# Patient Record
Sex: Female | Born: 1935 | Race: White | Hispanic: No | Marital: Married | Smoking: Former smoker
Health system: Southern US, Community
[De-identification: ages and names within clinical notes are randomized; demographics above are authoritative.]

## PROBLEM LIST (undated history)

## (undated) DIAGNOSIS — N1832 Chronic kidney disease, stage 3b: Secondary | ICD-10-CM

## (undated) DIAGNOSIS — I10 Essential (primary) hypertension: Secondary | ICD-10-CM

## (undated) DIAGNOSIS — M1990 Unspecified osteoarthritis, unspecified site: Secondary | ICD-10-CM

## (undated) DIAGNOSIS — E7800 Pure hypercholesterolemia, unspecified: Secondary | ICD-10-CM

## (undated) DIAGNOSIS — N289 Disorder of kidney and ureter, unspecified: Secondary | ICD-10-CM

## (undated) DIAGNOSIS — I4891 Unspecified atrial fibrillation: Secondary | ICD-10-CM

## (undated) DIAGNOSIS — G4730 Sleep apnea, unspecified: Secondary | ICD-10-CM

## (undated) HISTORY — PX: SHOULDER SURGERY: SHX246

## (undated) HISTORY — PX: BOWEL RESECTION: SHX1257

## (undated) HISTORY — PX: ABDOMINAL HYSTERECTOMY: SHX81

---

## 2010-04-06 HISTORY — PX: PARATHYROIDECTOMY: SHX19

## 2012-10-24 ENCOUNTER — Ambulatory Visit: Payer: Self-pay | Admitting: Internal Medicine

## 2012-11-02 ENCOUNTER — Ambulatory Visit: Payer: Self-pay | Admitting: Internal Medicine

## 2016-08-03 ENCOUNTER — Encounter: Payer: Self-pay | Admitting: Emergency Medicine

## 2016-08-03 ENCOUNTER — Emergency Department
Admission: EM | Admit: 2016-08-03 | Discharge: 2016-08-03 | Disposition: A | Discharge status: Home / Self Care | Payer: Medicare Other | Attending: Emergency Medicine | Admitting: Emergency Medicine

## 2016-08-03 ENCOUNTER — Emergency Department: Payer: Medicare Other

## 2016-08-03 DIAGNOSIS — I480 Paroxysmal atrial fibrillation: Secondary | ICD-10-CM | POA: Diagnosis not present

## 2016-08-03 DIAGNOSIS — Z79899 Other long term (current) drug therapy: Secondary | ICD-10-CM | POA: Insufficient documentation

## 2016-08-03 DIAGNOSIS — I10 Essential (primary) hypertension: Secondary | ICD-10-CM | POA: Diagnosis not present

## 2016-08-03 DIAGNOSIS — R002 Palpitations: Secondary | ICD-10-CM | POA: Diagnosis present

## 2016-08-03 DIAGNOSIS — Z87891 Personal history of nicotine dependence: Secondary | ICD-10-CM | POA: Diagnosis not present

## 2016-08-03 HISTORY — DX: Unspecified osteoarthritis, unspecified site: M19.90

## 2016-08-03 HISTORY — DX: Essential (primary) hypertension: I10

## 2016-08-03 HISTORY — DX: Pure hypercholesterolemia, unspecified: E78.00

## 2016-08-03 HISTORY — DX: Disorder of kidney and ureter, unspecified: N28.9

## 2016-08-03 LAB — CBC WITH DIFFERENTIAL/PLATELET
Basophils Absolute: 0.1 10*3/uL (ref 0–0.1)
Basophils Relative: 1 %
EOS ABS: 0.1 10*3/uL (ref 0–0.7)
Eosinophils Relative: 2 %
HEMATOCRIT: 37.7 % (ref 35.0–47.0)
HEMOGLOBIN: 12.3 g/dL (ref 12.0–16.0)
LYMPHS ABS: 1.2 10*3/uL (ref 1.0–3.6)
Lymphocytes Relative: 16 %
MCH: 29.3 pg (ref 26.0–34.0)
MCHC: 32.7 g/dL (ref 32.0–36.0)
MCV: 89.5 fL (ref 80.0–100.0)
MONO ABS: 0.6 10*3/uL (ref 0.2–0.9)
MONOS PCT: 8 %
NEUTROS PCT: 73 %
Neutro Abs: 5.7 10*3/uL (ref 1.4–6.5)
Platelets: 239 10*3/uL (ref 150–440)
RBC: 4.21 MIL/uL (ref 3.80–5.20)
RDW: 13.8 % (ref 11.5–14.5)
WBC: 7.8 10*3/uL (ref 3.6–11.0)

## 2016-08-03 LAB — BASIC METABOLIC PANEL
Anion gap: 12 (ref 5–15)
BUN: 29 mg/dL — AB (ref 6–20)
CALCIUM: 9.6 mg/dL (ref 8.9–10.3)
CHLORIDE: 105 mmol/L (ref 101–111)
CO2: 21 mmol/L — ABNORMAL LOW (ref 22–32)
CREATININE: 1.12 mg/dL — AB (ref 0.44–1.00)
GFR, EST AFRICAN AMERICAN: 52 mL/min — AB (ref >60)
GFR, EST NON AFRICAN AMERICAN: 45 mL/min — AB (ref >60)
Glucose, Bld: 134 mg/dL — ABNORMAL HIGH (ref 65–99)
Potassium: 4.8 mmol/L (ref 3.5–5.1)
SODIUM: 138 mmol/L (ref 135–145)

## 2016-08-03 LAB — TSH: TSH: 2.616 u[IU]/mL (ref 0.350–4.500)

## 2016-08-03 LAB — TROPONIN I

## 2016-08-03 LAB — MAGNESIUM: Magnesium: 2.2 mg/dL (ref 1.7–2.4)

## 2016-08-03 MED ORDER — ASPIRIN 81 MG PO CHEW
324.0000 mg | CHEWABLE_TABLET | Freq: Once | ORAL | Status: AC
  Administered 2016-08-03: 324 mg via ORAL
  Filled 2016-08-03: qty 4

## 2016-08-03 NOTE — Discharge Instructions (Signed)
Check your pulse in the morning, if your heart rate is over 105, consider taking an extra atenolol as you  did today. If you have chest pain, shortness of breath, nausea, vomiting, weakness, leg swelling, you feels like you might pass out or any other new or worrisome symptoms return to the emergency room. Cardiology can see you  tomorrow morning at 10:30, Dr. Lady Gary, please do not fail to go.  The address is provided.

## 2016-08-03 NOTE — ED Triage Notes (Signed)
States took regular medications this morning.  Took 25 metoprolol last night and then repeated 25 mg metoprolol this morning at 0530.

## 2016-08-03 NOTE — ED Notes (Addendum)
Attempted IV x2 without success - one attempt left fa - second attempt right hand - purple top recollected for lab

## 2016-08-03 NOTE — ED Notes (Signed)
Pt reports she is having palpitations - she reports that her heart rate is going fast and then slow - this episode started at 3am - pt has no cardiac history - denies chest pain or shortness of breath - denies N/V

## 2016-08-03 NOTE — ED Notes (Signed)
Pt assisted to toilet to void 

## 2016-08-03 NOTE — ED Triage Notes (Signed)
C/O palpitations today, starting at 0300.  Palpitations woke patient up from sleep.  Denies chest pain or SOB.

## 2016-08-03 NOTE — ED Provider Notes (Addendum)
[facility] Emergency Department Provider Note  ____________________________________________   I have reviewed the triage vital signs and the nursing notes.   HISTORY  Chief Complaint Palpitations    HPI Stacy Conway is a 82 y.o. female  who presents today complaining of palpitations. Patient has had palpations off and on for several years. She states usually last about 10 minutes and go away. She did take extra atenolol, not metoprolol, this morning. 25 mg. She is still having a sensation that her heart rate is regular rate she denies chest pain shortness of breath she denies any lower shortly edema. She states that she does have an aspirin intolerance which is diarrhea but no history of rectal bleeding, and no anaphylactic reaction to aspirin. She has never had this evaluated by cardiology before. She denies any recent symptoms that might have brought this on such as URI or fatigue etc. She does not know of any problems with her thyroid and she has not had any change in her medications. She denies any fever or chills. She just states that she noticed that her heart is beating irregularly. Nothing makes it better nothing makes it worse is no pain there is no radiation, no alleviating or aggravate symptoms. The history of GI bleed melena bright red blood per rectum or head bleed that would be counter indicated to anticoagulation if needed.    Past Medical History:  Diagnosis Date  . Arthritis   . High cholesterol   . Hypertension   . Renal disorder    ckd    There are no active problems to display for this patient.   Past Surgical History:  Procedure Laterality Date  . ABDOMINAL HYSTERECTOMY    . BOWEL RESECTION    . PARATHYROIDECTOMY  2012  . SHOULDER SURGERY      Prior to Admission medications   Not on File    Allergies Aspirin; Nsaids; Penicillins; and Sulfa antibiotics  No family history on file.  Social History Social History   Substance Use Topics  . Smoking status: Former Games developer  . Smokeless tobacco: Never Used  . Alcohol use No    Review of Systems Constitutional: No fever/chills Eyes: No visual changes. ENT: No sore throat. No stiff neck no neck pain Cardiovascular: Denies chest pain. Respiratory: Denies shortness of breath. Gastrointestinal:   no vomiting.  No diarrhea.  No constipation. Genitourinary: Negative for dysuria. Musculoskeletal: Negative lower extremity swelling Skin: Negative for rash. Neurological: Negative for severe headaches, focal weakness or numbness. 10-point ROS otherwise negative.  ____________________________________________   PHYSICAL EXAM:  VITAL SIGNS: ED Triage Vitals  Enc Vitals Group     BP 08/03/16 1129 (!) 141/59     Pulse Rate 08/03/16 1129 (!) 104     Resp 08/03/16 1129 18     Temp 08/03/16 1129 97.4 F (36.3 C)     Temp Source 08/03/16 1129 Oral     SpO2 08/03/16 1129 96 %     Weight 08/03/16 1122 210 lb (95.3 kg)     Height 08/03/16 1122  (1.549 m)     Head Circumference --      Peak Flow --      Pain Score 08/03/16 1122 0     Pain Loc --      Pain Edu? --      Excl. in GC? --     Constitutional: Alert and oriented. Well appearing and in no acute distress. Eyes: Conjunctivae are normal. PERRL. EOMI. Head:  Atraumatic. Nose: No congestion/rhinnorhea. Mouth/Throat: Mucous membranes are moist.  Oropharynx non-erythematous. Neck: No stridor.   Nontender with no meningismus Cardiovascular: Normal rate, Irregularly irregular rhythm. Grossly normal heart sounds.  Good peripheral circulation. Respiratory: Normal respiratory effort.  No retractions. Lungs CTAB. Abdominal: Soft and nontender. No distention. No guarding no rebound Back:  There is no focal tenderness or step off.  there is no midline tenderness there are no lesions noted. there is no CVA tenderness Musculoskeletal: No lower extremity tenderness, no upper extremity tenderness. No joint  effusions, no DVT signs strong distal pulses no edema Neurologic:  Normal speech and language. No gross focal neurologic deficits are appreciated.  Skin:  Skin is warm, dry and intact. No rash noted. Psychiatric: Mood and affect are normal. Speech and behavior are normal.  ____________________________________________   LABS (all labs ordered are listed, but only abnormal results are displayed)  Labs Reviewed  BASIC METABOLIC PANEL - Abnormal; Notable for the following:       Result Value   CO2 21 (*)    Glucose, Bld 134 (*)    BUN 29 (*)    Creatinine, Ser 1.12 (*)    GFR calc non Af Amer 45 (*)    GFR calc Af Amer 52 (*)    All other components within normal limits  TROPONIN I  CBC WITH DIFFERENTIAL/PLATELET  TSH  MAGNESIUM   ____________________________________________  EKG  I personally interpreted any EKGs ordered by me or triage Atrial fibrillation rate 89 bpm, no acute ST elevation or depression, no acute ischemic changes, normal axis ____________________________________________  RADIOLOGY  I reviewed any imaging ordered by me or triage that were performed during my shift and, if possible, patient and/or family made aware of any abnormal findings. ____________________________________________   PROCEDURES  Procedure(s) performed: None  Procedures  Critical Care performed: None  ____________________________________________   INITIAL IMPRESSION / ASSESSMENT AND PLAN / ED COURSE  Pertinent labs & imaging results that were available during my care of the patient were reviewed by me and considered in my medical decision making (see chart for details).  Patient with minimally symptomatic rate controlled likely paroxysmal by history, but as far as medical care is concerned new-onset atrial fibrillation. There is no evidence of ischemia or ongoing chest pain. No shortness of breath. Patient is not allergic to aspirin and has given her diarrhea although she has  chronic diarrhea, and she is not actually sure of the aspirin contributes to it or not, we will give her a dose of aspirin here after discussion with cardiology. Rate has been well controlled on her home atenolol which she took an extra dose of today and pressures are good. No chest pain or shortness of breath, thyroid is reassuring magnesium is reassuring troponin is reassuring she is not anemic creatinine is reassuring etc.  ----------------------------------------- 1:13 PM on 08/03/2016 -----------------------------------------  Discussed with Dr. Lady Gary, of cardiology, and we discussed the patient's history, exam, vital signs, EKG, blood work and presentation, he feels the patient is safe for discharge at this time. He does not recommend emergent cardioversion. He does not recommend starting Eliquis or other significant anticoagulation. He does agree with giving the patient an aspirin here today, we discuss her relative intolerance of it, he will see her tomorrow morning at 10:30. Patient recovered with this plan, extensive return precautions given and understood. She is with her family. We will also inform her PCP.   ____________________________________________   FINAL CLINICAL IMPRESSION(S) / ED DIAGNOSES  Final diagnoses:  Palpitations      This chart was dictated using voice recognition software.  Despite best efforts to proofread,  errors can occur which can change meaning.      Jeanmarie Plant, MD 08/03/16 1303    Jeanmarie Plant, MD 08/03/16 1314

## 2017-04-12 ENCOUNTER — Inpatient Hospital Stay: Admission: RE | Admit: 2017-04-12 | Payer: Medicare Other | Source: Ambulatory Visit

## 2017-04-15 ENCOUNTER — Encounter
Admission: RE | Admit: 2017-04-15 | Discharge: 2017-04-15 | Disposition: A | Discharge status: Home / Self Care | Payer: Medicare Other | Source: Ambulatory Visit | Attending: Orthopedic Surgery | Admitting: Orthopedic Surgery

## 2017-04-15 ENCOUNTER — Other Ambulatory Visit: Payer: Self-pay

## 2017-04-15 DIAGNOSIS — Z9889 Other specified postprocedural states: Secondary | ICD-10-CM | POA: Diagnosis not present

## 2017-04-15 DIAGNOSIS — E785 Hyperlipidemia, unspecified: Secondary | ICD-10-CM | POA: Insufficient documentation

## 2017-04-15 DIAGNOSIS — Z886 Allergy status to analgesic agent status: Secondary | ICD-10-CM | POA: Diagnosis not present

## 2017-04-15 DIAGNOSIS — Z800 Family history of malignant neoplasm of digestive organs: Secondary | ICD-10-CM | POA: Insufficient documentation

## 2017-04-15 DIAGNOSIS — Z888 Allergy status to other drugs, medicaments and biological substances status: Secondary | ICD-10-CM | POA: Insufficient documentation

## 2017-04-15 DIAGNOSIS — Z7901 Long term (current) use of anticoagulants: Secondary | ICD-10-CM | POA: Insufficient documentation

## 2017-04-15 DIAGNOSIS — M1612 Unilateral primary osteoarthritis, left hip: Secondary | ICD-10-CM | POA: Insufficient documentation

## 2017-04-15 DIAGNOSIS — Z90710 Acquired absence of both cervix and uterus: Secondary | ICD-10-CM | POA: Insufficient documentation

## 2017-04-15 DIAGNOSIS — Z79899 Other long term (current) drug therapy: Secondary | ICD-10-CM | POA: Diagnosis not present

## 2017-04-15 DIAGNOSIS — Z8249 Family history of ischemic heart disease and other diseases of the circulatory system: Secondary | ICD-10-CM | POA: Insufficient documentation

## 2017-04-15 DIAGNOSIS — Z882 Allergy status to sulfonamides status: Secondary | ICD-10-CM | POA: Insufficient documentation

## 2017-04-15 DIAGNOSIS — E213 Hyperparathyroidism, unspecified: Secondary | ICD-10-CM | POA: Diagnosis not present

## 2017-04-15 DIAGNOSIS — I10 Essential (primary) hypertension: Secondary | ICD-10-CM | POA: Insufficient documentation

## 2017-04-15 DIAGNOSIS — Z880 Allergy status to penicillin: Secondary | ICD-10-CM | POA: Diagnosis not present

## 2017-04-15 DIAGNOSIS — Z881 Allergy status to other antibiotic agents status: Secondary | ICD-10-CM | POA: Diagnosis not present

## 2017-04-15 DIAGNOSIS — Z01812 Encounter for preprocedural laboratory examination: Secondary | ICD-10-CM | POA: Diagnosis not present

## 2017-04-15 HISTORY — DX: Sleep apnea, unspecified: G47.30

## 2017-04-15 HISTORY — DX: Unspecified atrial fibrillation: I48.91

## 2017-04-15 LAB — CBC
HEMATOCRIT: 39.8 % (ref 35.0–47.0)
HEMOGLOBIN: 12.9 g/dL (ref 12.0–16.0)
MCH: 29.9 pg (ref 26.0–34.0)
MCHC: 32.4 g/dL (ref 32.0–36.0)
MCV: 92.4 fL (ref 80.0–100.0)
Platelets: 277 10*3/uL (ref 150–440)
RBC: 4.31 MIL/uL (ref 3.80–5.20)
RDW: 13.8 % (ref 11.5–14.5)
WBC: 8.9 10*3/uL (ref 3.6–11.0)

## 2017-04-15 LAB — SURGICAL PCR SCREEN
MRSA, PCR: NEGATIVE
STAPHYLOCOCCUS AUREUS: NEGATIVE

## 2017-04-15 LAB — BASIC METABOLIC PANEL
ANION GAP: 12 (ref 5–15)
BUN: 32 mg/dL — AB (ref 6–20)
CO2: 22 mmol/L (ref 22–32)
Calcium: 9.3 mg/dL (ref 8.9–10.3)
Chloride: 105 mmol/L (ref 101–111)
Creatinine, Ser: 1.05 mg/dL — ABNORMAL HIGH (ref 0.44–1.00)
GFR calc Af Amer: 57 mL/min — ABNORMAL LOW (ref >60)
GFR, EST NON AFRICAN AMERICAN: 49 mL/min — AB (ref >60)
Glucose, Bld: 109 mg/dL — ABNORMAL HIGH (ref 65–99)
POTASSIUM: 4.7 mmol/L (ref 3.5–5.1)
SODIUM: 139 mmol/L (ref 135–145)

## 2017-04-15 LAB — URINALYSIS, ROUTINE W REFLEX MICROSCOPIC
Bilirubin Urine: NEGATIVE
Glucose, UA: NEGATIVE mg/dL
Hgb urine dipstick: NEGATIVE
Ketones, ur: NEGATIVE mg/dL
Leukocytes, UA: NEGATIVE
Nitrite: NEGATIVE
Protein, ur: NEGATIVE mg/dL
pH: 5.5 (ref 5.0–8.0)

## 2017-04-15 LAB — TYPE AND SCREEN
ABO/RH(D): A POS
Antibody Screen: NEGATIVE

## 2017-04-15 LAB — PROTIME-INR
INR: 1.08
PROTHROMBIN TIME: 13.9 s (ref 11.4–15.2)

## 2017-04-15 LAB — APTT: APTT: 32 s (ref 24–36)

## 2017-04-15 LAB — SEDIMENTATION RATE: SED RATE: 77 mm/h — AB (ref 0–30)

## 2017-04-15 NOTE — Patient Instructions (Signed)
Your procedure is scheduled on: Thursday 04/22/17 Report to DAY SURGERY. 2ND FLOOR MEDICAL MALL ENTRANCE. To find out your arrival time please call [phone] between 1PM - 3PM on Wednesday 04/21/17.  Remember: Instructions that are not followed completely may result in serious medical risk, up to and including death, or upon the discretion of your surgeon and anesthesiologist your surgery may need to be rescheduled.    __X__ 1. Do not eat anything after midnight the night before your    procedure.  No gum chewing or hard candies.  You may drink clear   liquids up to 2 hours before you are scheduled to arrive at the   hospital for your procedure. Do not drink clear liquids within 2   hours of scheduled arrival to the hospital as this may lead to your   procedure being delayed or rescheduled.       Clear liquids include:   Water or Apple juice without pulp   Clear carbohydrate beverage such as Clearfast or Gatorade   Black coffee or Clear Tea (no milk, no creamer, do not add anything   to the coffee or tea)    Diabetics should only drink water   __X__ 2. No Alcohol for 24 hours before or after surgery.   ____ 3. Bring all medications with you on the day of surgery if instructed.    __X__ 4. Notify your doctor if there is any change in your medical condition     (cold, fever, infections).             __X___5. No smoking within 24 hours of your surgery.     Do not wear jewelry, make-up, hairpins, clips or nail polish.  Do not wear lotions, powders, or perfumes.   Do not shave 48 hours prior to surgery. Men may shave face and neck.  Do not bring valuables to the hospital.    [facility] any belongings or valuables.               Contacts, dentures or bridgework may not be worn into surgery.  Leave your suitcase in the car. After surgery it may be brought to your room.  For patients admitted to the hospital, discharge time is determined by your                 treatment team.   Patients discharged the day of surgery will not be allowed to drive home.   Please read over the following fact sheets that you were given:   MRSA Information   __X__ Take these medicines the morning of surgery with A SIP OF WATER:    1. NONE  2.   3.   4.  5.  6.  ____ Fleet Enema (as directed)   __X__ Use CHG Soap/SAGE wipes as directed  ____ Use inhalers on the day of surgery  ____ Stop metformin 2 days prior to surgery    ____ Take 1/2 of usual insulin dose the night before surgery and none on the morning of surgery.   __X__ Stop ELIQUIS Coumadin/Plavix/aspirin on 04/20/17  __X__ Stop Anti-inflammatories such as Advil, Aleve, Ibuprofen, Motrin, Naproxen, Naprosyn, Goodies,powder, or aspirin products.  OK to take Tylenol.   __X__ Stop supplements, Vitamin E, Fish Oil until after surgery.  (CRANBERRY, OSTEO BI FLEX, LUTEIN, TURMERIC) TODAY  __X__ Bring C-Pap to the hospital.

## 2017-04-16 LAB — URINE CULTURE

## 2017-04-17 NOTE — Pre-Procedure Instructions (Signed)
Urine culture and UA results sent to Dr. Rosita KeaMenz for review.  Asked if wanted it recollected as suggested?  Also asked Dr. Rosita KeaMenz if he wanted to order knee high Teds and foley catheter in OR?

## 2017-04-21 MED ORDER — CLINDAMYCIN PHOSPHATE 900 MG/50ML IV SOLN
900.0000 mg | Freq: Once | INTRAVENOUS | Status: AC
  Administered 2017-04-22: 900 mg via INTRAVENOUS

## 2017-04-22 ENCOUNTER — Encounter: Payer: Self-pay | Admitting: *Deleted

## 2017-04-22 ENCOUNTER — Encounter
Admission: RE | Disposition: A | Discharge status: Skilled Nursing Facility | Payer: Self-pay | Source: Ambulatory Visit | Attending: Orthopedic Surgery

## 2017-04-22 ENCOUNTER — Inpatient Hospital Stay: Payer: Medicare Other

## 2017-04-22 ENCOUNTER — Inpatient Hospital Stay: Payer: Medicare Other | Admitting: Certified Registered"

## 2017-04-22 ENCOUNTER — Other Ambulatory Visit: Payer: Self-pay

## 2017-04-22 ENCOUNTER — Inpatient Hospital Stay
Admission: RE | Admit: 2017-04-22 | Discharge: 2017-04-25 | DRG: 470 | Disposition: A | Discharge status: Skilled Nursing Facility | Payer: Medicare Other | Source: Ambulatory Visit | Attending: Orthopedic Surgery | Admitting: Orthopedic Surgery

## 2017-04-22 DIAGNOSIS — M1612 Unilateral primary osteoarthritis, left hip: Secondary | ICD-10-CM | POA: Diagnosis present

## 2017-04-22 DIAGNOSIS — D62 Acute posthemorrhagic anemia: Secondary | ICD-10-CM | POA: Diagnosis not present

## 2017-04-22 DIAGNOSIS — Z882 Allergy status to sulfonamides status: Secondary | ICD-10-CM

## 2017-04-22 DIAGNOSIS — G8918 Other acute postprocedural pain: Secondary | ICD-10-CM

## 2017-04-22 DIAGNOSIS — N189 Chronic kidney disease, unspecified: Secondary | ICD-10-CM | POA: Diagnosis present

## 2017-04-22 DIAGNOSIS — Z886 Allergy status to analgesic agent status: Secondary | ICD-10-CM | POA: Diagnosis not present

## 2017-04-22 DIAGNOSIS — Z881 Allergy status to other antibiotic agents status: Secondary | ICD-10-CM | POA: Diagnosis not present

## 2017-04-22 DIAGNOSIS — I129 Hypertensive chronic kidney disease with stage 1 through stage 4 chronic kidney disease, or unspecified chronic kidney disease: Secondary | ICD-10-CM | POA: Diagnosis present

## 2017-04-22 DIAGNOSIS — F17200 Nicotine dependence, unspecified, uncomplicated: Secondary | ICD-10-CM | POA: Diagnosis present

## 2017-04-22 DIAGNOSIS — Z419 Encounter for procedure for purposes other than remedying health state, unspecified: Secondary | ICD-10-CM

## 2017-04-22 DIAGNOSIS — Z880 Allergy status to penicillin: Secondary | ICD-10-CM

## 2017-04-22 DIAGNOSIS — G4730 Sleep apnea, unspecified: Secondary | ICD-10-CM | POA: Diagnosis present

## 2017-04-22 DIAGNOSIS — Z91013 Allergy to seafood: Secondary | ICD-10-CM | POA: Diagnosis not present

## 2017-04-22 DIAGNOSIS — I4891 Unspecified atrial fibrillation: Secondary | ICD-10-CM | POA: Diagnosis present

## 2017-04-22 DIAGNOSIS — E7800 Pure hypercholesterolemia, unspecified: Secondary | ICD-10-CM | POA: Diagnosis present

## 2017-04-22 HISTORY — PX: TOTAL HIP ARTHROPLASTY: SHX124

## 2017-04-22 LAB — ABO/RH: ABO/RH(D): A POS

## 2017-04-22 SURGERY — ARTHROPLASTY, HIP, TOTAL, ANTERIOR APPROACH
Anesthesia: General | Site: Hip | Laterality: Left | Wound class: Clean

## 2017-04-22 MED ORDER — FENTANYL CITRATE (PF) 100 MCG/2ML IJ SOLN
INTRAMUSCULAR | Status: AC
  Filled 2017-04-22: qty 2

## 2017-04-22 MED ORDER — ONDANSETRON HCL 4 MG/2ML IJ SOLN: 4.0000 mg | Freq: Once | INTRAMUSCULAR | Status: DC | PRN

## 2017-04-22 MED ORDER — ONDANSETRON HCL 4 MG/2ML IJ SOLN
INTRAMUSCULAR | Status: AC
  Filled 2017-04-22: qty 2

## 2017-04-22 MED ORDER — BUPIVACAINE-EPINEPHRINE (PF) 0.25% -1:200000 IJ SOLN
INTRAMUSCULAR | Status: DC | PRN
  Administered 2017-04-22: 30 mL via PERINEURAL

## 2017-04-22 MED ORDER — BISACODYL 10 MG RE SUPP
10.0000 mg | Freq: Every day | RECTAL | Status: DC | PRN
  Administered 2017-04-25: 10 mg via RECTAL
  Filled 2017-04-22: qty 1

## 2017-04-22 MED ORDER — KETAMINE HCL 50 MG/ML IJ SOLN
INTRAMUSCULAR | Status: AC
  Filled 2017-04-22: qty 10

## 2017-04-22 MED ORDER — APIXABAN 5 MG PO TABS
5.0000 mg | ORAL_TABLET | Freq: Two times a day (BID) | ORAL | Status: DC
  Administered 2017-04-22 – 2017-04-25 (×6): 5 mg via ORAL
  Filled 2017-04-22 (×6): qty 1

## 2017-04-22 MED ORDER — SUGAMMADEX SODIUM 200 MG/2ML IV SOLN
INTRAVENOUS | Status: DC | PRN
  Administered 2017-04-22: 200 mg via INTRAVENOUS

## 2017-04-22 MED ORDER — SODIUM CHLORIDE 0.9 % IV SOLN
INTRAVENOUS | Status: DC
  Administered 2017-04-22: 10:00:00 via INTRAVENOUS
  Administered 2017-04-23: 75 mL/h via INTRAVENOUS

## 2017-04-22 MED ORDER — FAMOTIDINE 20 MG PO TABS
20.0000 mg | ORAL_TABLET | Freq: Once | ORAL | Status: AC
  Administered 2017-04-22: 20 mg via ORAL

## 2017-04-22 MED ORDER — OXYCODONE HCL 5 MG PO TABS
10.0000 mg | ORAL_TABLET | ORAL | Status: DC | PRN
  Administered 2017-04-22 – 2017-04-24 (×6): 10 mg via ORAL
  Filled 2017-04-22 (×6): qty 2

## 2017-04-22 MED ORDER — DOCUSATE SODIUM 100 MG PO CAPS
100.0000 mg | ORAL_CAPSULE | Freq: Two times a day (BID) | ORAL | Status: DC
  Administered 2017-04-22 – 2017-04-25 (×6): 100 mg via ORAL
  Filled 2017-04-22 (×7): qty 1

## 2017-04-22 MED ORDER — ALUM & MAG HYDROXIDE-SIMETH 200-200-20 MG/5ML PO SUSP: 30.0000 mL | ORAL | Status: DC | PRN

## 2017-04-22 MED ORDER — CLINDAMYCIN PHOSPHATE 900 MG/50ML IV SOLN
900.0000 mg | Freq: Four times a day (QID) | INTRAVENOUS | Status: AC
  Administered 2017-04-22 – 2017-04-23 (×3): 900 mg via INTRAVENOUS
  Filled 2017-04-22 (×3): qty 50

## 2017-04-22 MED ORDER — AMLODIPINE BESYLATE 10 MG PO TABS
10.0000 mg | ORAL_TABLET | Freq: Every evening | ORAL | Status: DC
  Administered 2017-04-23 – 2017-04-24 (×2): 10 mg via ORAL
  Filled 2017-04-22 (×2): qty 1

## 2017-04-22 MED ORDER — SIMVASTATIN 20 MG PO TABS
20.0000 mg | ORAL_TABLET | Freq: Every evening | ORAL | Status: DC
  Administered 2017-04-22 – 2017-04-24 (×3): 20 mg via ORAL
  Filled 2017-04-22 (×3): qty 1

## 2017-04-22 MED ORDER — OXYCODONE HCL 5 MG PO TABS
5.0000 mg | ORAL_TABLET | ORAL | Status: DC | PRN
  Administered 2017-04-23 – 2017-04-25 (×4): 5 mg via ORAL
  Filled 2017-04-22 (×5): qty 1

## 2017-04-22 MED ORDER — METHOCARBAMOL 1000 MG/10ML IJ SOLN
500.0000 mg | Freq: Four times a day (QID) | INTRAVENOUS | Status: DC | PRN
  Filled 2017-04-22: qty 5

## 2017-04-22 MED ORDER — METOCLOPRAMIDE HCL 5 MG/ML IJ SOLN: 5.0000 mg | Freq: Three times a day (TID) | INTRAMUSCULAR | Status: DC | PRN

## 2017-04-22 MED ORDER — ACETAMINOPHEN 650 MG RE SUPP: 650.0000 mg | RECTAL | Status: DC | PRN

## 2017-04-22 MED ORDER — LIDOCAINE HCL (CARDIAC) 20 MG/ML IV SOLN
INTRAVENOUS | Status: DC | PRN
  Administered 2017-04-22: 50 mg via INTRAVENOUS

## 2017-04-22 MED ORDER — METOCLOPRAMIDE HCL 10 MG PO TABS: 5.0000 mg | ORAL_TABLET | Freq: Three times a day (TID) | ORAL | Status: DC | PRN

## 2017-04-22 MED ORDER — DEXAMETHASONE SODIUM PHOSPHATE 10 MG/ML IJ SOLN
INTRAMUSCULAR | Status: DC | PRN
  Administered 2017-04-22: 5 mg via INTRAVENOUS

## 2017-04-22 MED ORDER — ZOLPIDEM TARTRATE 5 MG PO TABS: 5.0000 mg | ORAL_TABLET | Freq: Every evening | ORAL | Status: DC | PRN

## 2017-04-22 MED ORDER — PHENOL 1.4 % MT LIQD
1.0000 | OROMUCOSAL | Status: DC | PRN
  Filled 2017-04-22: qty 177

## 2017-04-22 MED ORDER — MIDAZOLAM HCL 2 MG/2ML IJ SOLN
INTRAMUSCULAR | Status: AC
  Filled 2017-04-22: qty 2

## 2017-04-22 MED ORDER — DEXAMETHASONE SODIUM PHOSPHATE 10 MG/ML IJ SOLN
INTRAMUSCULAR | Status: AC
  Filled 2017-04-22: qty 1

## 2017-04-22 MED ORDER — ACETAMINOPHEN 325 MG PO TABS: 650.0000 mg | ORAL_TABLET | ORAL | Status: DC | PRN

## 2017-04-22 MED ORDER — OXYCODONE HCL 5 MG PO TABS
ORAL_TABLET | ORAL | Status: AC
  Administered 2017-04-22: 10 mg
  Filled 2017-04-22: qty 2

## 2017-04-22 MED ORDER — PHENYLEPHRINE HCL 10 MG/ML IJ SOLN
INTRAMUSCULAR | Status: AC
  Filled 2017-04-22: qty 1

## 2017-04-22 MED ORDER — MORPHINE SULFATE (PF) 4 MG/ML IV SOLN
INTRAVENOUS | Status: AC
  Administered 2017-04-22: 2 mg via INTRAVENOUS
  Filled 2017-04-22: qty 1

## 2017-04-22 MED ORDER — SUCCINYLCHOLINE CHLORIDE 20 MG/ML IJ SOLN
INTRAMUSCULAR | Status: DC | PRN
  Administered 2017-04-22: 100 mg via INTRAVENOUS

## 2017-04-22 MED ORDER — MENTHOL 3 MG MT LOZG
1.0000 | LOZENGE | OROMUCOSAL | Status: DC | PRN
  Filled 2017-04-22: qty 9

## 2017-04-22 MED ORDER — MORPHINE SULFATE (PF) 4 MG/ML IV SOLN
2.0000 mg | INTRAVENOUS | Status: DC | PRN
  Administered 2017-04-22 (×4): 2 mg via INTRAVENOUS
  Filled 2017-04-22: qty 1

## 2017-04-22 MED ORDER — FENTANYL CITRATE (PF) 100 MCG/2ML IJ SOLN
INTRAMUSCULAR | Status: AC
  Administered 2017-04-22: 25 ug via INTRAVENOUS
  Filled 2017-04-22: qty 2

## 2017-04-22 MED ORDER — LISINOPRIL 20 MG PO TABS
20.0000 mg | ORAL_TABLET | Freq: Every evening | ORAL | Status: DC
  Administered 2017-04-22 – 2017-04-24 (×3): 20 mg via ORAL
  Filled 2017-04-22 (×3): qty 1

## 2017-04-22 MED ORDER — FENTANYL CITRATE (PF) 100 MCG/2ML IJ SOLN
25.0000 ug | INTRAMUSCULAR | Status: AC | PRN
  Administered 2017-04-22 (×6): 25 ug via INTRAVENOUS

## 2017-04-22 MED ORDER — PROPOFOL 10 MG/ML IV BOLUS
INTRAVENOUS | Status: DC | PRN
  Administered 2017-04-22: 150 mg via INTRAVENOUS
  Administered 2017-04-22: 50 mg via INTRAVENOUS

## 2017-04-22 MED ORDER — NEOMYCIN-POLYMYXIN B GU 40-200000 IR SOLN
Status: AC
  Filled 2017-04-22: qty 4

## 2017-04-22 MED ORDER — FENTANYL CITRATE (PF) 100 MCG/2ML IJ SOLN
INTRAMUSCULAR | Status: DC | PRN
  Administered 2017-04-22: 100 ug via INTRAVENOUS
  Administered 2017-04-22 (×2): 50 ug via INTRAVENOUS

## 2017-04-22 MED ORDER — KETAMINE HCL 50 MG/ML IJ SOLN
INTRAMUSCULAR | Status: DC | PRN
  Administered 2017-04-22: 25 mg via INTRAVENOUS

## 2017-04-22 MED ORDER — GLYCOPYRROLATE 0.2 MG/ML IJ SOLN
INTRAMUSCULAR | Status: DC | PRN
  Administered 2017-04-22: 0.2 mg via INTRAVENOUS

## 2017-04-22 MED ORDER — BUPIVACAINE HCL (PF) 0.5 % IJ SOLN
INTRAMUSCULAR | Status: AC
  Filled 2017-04-22: qty 10

## 2017-04-22 MED ORDER — EPHEDRINE SULFATE 50 MG/ML IJ SOLN
INTRAMUSCULAR | Status: DC | PRN
  Administered 2017-04-22: 10 mg via INTRAVENOUS
  Administered 2017-04-22: 5 mg via INTRAVENOUS

## 2017-04-22 MED ORDER — ONDANSETRON HCL 4 MG PO TABS: 4.0000 mg | ORAL_TABLET | Freq: Four times a day (QID) | ORAL | Status: DC | PRN

## 2017-04-22 MED ORDER — SUGAMMADEX SODIUM 200 MG/2ML IV SOLN
INTRAVENOUS | Status: AC
  Filled 2017-04-22: qty 2

## 2017-04-22 MED ORDER — EPHEDRINE SULFATE 50 MG/ML IJ SOLN
INTRAMUSCULAR | Status: AC
  Filled 2017-04-22: qty 1

## 2017-04-22 MED ORDER — FUROSEMIDE 40 MG PO TABS: 40.0000 mg | ORAL_TABLET | Freq: Every day | ORAL | Status: DC | PRN

## 2017-04-22 MED ORDER — FAMOTIDINE 20 MG PO TABS
ORAL_TABLET | ORAL | Status: AC
  Filled 2017-04-22: qty 1

## 2017-04-22 MED ORDER — MAGNESIUM CITRATE PO SOLN
1.0000 | Freq: Once | ORAL | Status: DC | PRN
  Filled 2017-04-22: qty 296

## 2017-04-22 MED ORDER — BUPIVACAINE-EPINEPHRINE (PF) 0.25% -1:200000 IJ SOLN
INTRAMUSCULAR | Status: AC
  Filled 2017-04-22: qty 30

## 2017-04-22 MED ORDER — NEOMYCIN-POLYMYXIN B GU 40-200000 IR SOLN
Status: DC | PRN
  Administered 2017-04-22: 4 mL

## 2017-04-22 MED ORDER — ONDANSETRON HCL 4 MG/2ML IJ SOLN
INTRAMUSCULAR | Status: DC | PRN
  Administered 2017-04-22: 4 mg via INTRAVENOUS

## 2017-04-22 MED ORDER — ACETAMINOPHEN 10 MG/ML IV SOLN
INTRAVENOUS | Status: AC
  Filled 2017-04-22: qty 100

## 2017-04-22 MED ORDER — DIPHENHYDRAMINE HCL 12.5 MG/5ML PO ELIX
12.5000 mg | ORAL_SOLUTION | ORAL | Status: DC | PRN
  Administered 2017-04-23: 25 mg via ORAL
  Filled 2017-04-22: qty 10

## 2017-04-22 MED ORDER — CLINDAMYCIN PHOSPHATE 900 MG/50ML IV SOLN
INTRAVENOUS | Status: AC
  Filled 2017-04-22: qty 50

## 2017-04-22 MED ORDER — ONDANSETRON HCL 4 MG/2ML IJ SOLN: 4.0000 mg | Freq: Four times a day (QID) | INTRAMUSCULAR | Status: DC | PRN

## 2017-04-22 MED ORDER — LACTATED RINGERS IV SOLN
INTRAVENOUS | Status: DC
  Administered 2017-04-22: 07:00:00 via INTRAVENOUS

## 2017-04-22 MED ORDER — MAGNESIUM HYDROXIDE 400 MG/5ML PO SUSP: 30.0000 mL | Freq: Every day | ORAL | Status: DC | PRN

## 2017-04-22 MED ORDER — ATENOLOL 25 MG PO TABS
25.0000 mg | ORAL_TABLET | Freq: Every evening | ORAL | Status: DC
  Administered 2017-04-23 – 2017-04-24 (×2): 25 mg via ORAL
  Filled 2017-04-22: qty 1

## 2017-04-22 MED ORDER — LORATADINE 10 MG PO TABS
10.0000 mg | ORAL_TABLET | Freq: Every day | ORAL | Status: DC
  Administered 2017-04-22 – 2017-04-24 (×3): 10 mg via ORAL
  Filled 2017-04-22 (×3): qty 1

## 2017-04-22 MED ORDER — ROCURONIUM BROMIDE 100 MG/10ML IV SOLN
INTRAVENOUS | Status: DC | PRN
  Administered 2017-04-22: 30 mg via INTRAVENOUS

## 2017-04-22 MED ORDER — SUCCINYLCHOLINE CHLORIDE 20 MG/ML IJ SOLN
INTRAMUSCULAR | Status: AC
  Filled 2017-04-22: qty 1

## 2017-04-22 MED ORDER — LIDOCAINE HCL (PF) 2 % IJ SOLN
INTRAMUSCULAR | Status: AC
  Filled 2017-04-22: qty 10

## 2017-04-22 MED ORDER — PROPOFOL 10 MG/ML IV BOLUS
INTRAVENOUS | Status: AC
  Filled 2017-04-22: qty 20

## 2017-04-22 MED ORDER — METHOCARBAMOL 500 MG PO TABS
500.0000 mg | ORAL_TABLET | Freq: Four times a day (QID) | ORAL | Status: DC | PRN
  Administered 2017-04-23 (×2): 500 mg via ORAL
  Filled 2017-04-22 (×2): qty 1

## 2017-04-22 MED ORDER — ACETAMINOPHEN 10 MG/ML IV SOLN
INTRAVENOUS | Status: DC | PRN
  Administered 2017-04-22: 1000 mg via INTRAVENOUS

## 2017-04-22 SURGICAL SUPPLY — 55 items
BLADE SAW SAG 18.5X105 (BLADE) ×2 IMPLANT
BNDG COHESIVE 6X5 TAN STRL LF (GAUZE/BANDAGES/DRESSINGS) ×6 IMPLANT
CANISTER SUCT 1200ML W/VALVE (MISCELLANEOUS) ×2 IMPLANT
CAPT HIP TOTAL 3 ×2 IMPLANT
CHLORAPREP W/TINT 26ML (MISCELLANEOUS) ×2 IMPLANT
DRAPE C-ARM XRAY 36X54 (DRAPES) ×2 IMPLANT
DRAPE INCISE IOBAN 66X60 STRL (DRAPES) IMPLANT
DRAPE POUCH INSTRU U-SHP 10X18 (DRAPES) ×2 IMPLANT
DRAPE SHEET LG 3/4 BI-LAMINATE (DRAPES) ×6 IMPLANT
DRAPE TABLE BACK 80X90 (DRAPES) ×2 IMPLANT
DRESSING SURGICEL FIBRLLR 1X2 (HEMOSTASIS) ×2 IMPLANT
DRSG OPSITE POSTOP 4X8 (GAUZE/BANDAGES/DRESSINGS) ×4 IMPLANT
DRSG SURGICEL FIBRILLAR 1X2 (HEMOSTASIS) ×4
ELECT BLADE 6.5 EXT (BLADE) ×2 IMPLANT
ELECT REM PT RETURN 9FT ADLT (ELECTROSURGICAL) ×2
ELECTRODE REM PT RTRN 9FT ADLT (ELECTROSURGICAL) ×1 IMPLANT
EVACUATOR 1/8 PVC DRAIN (DRAIN) ×2 IMPLANT
GLOVE BIOGEL PI IND STRL 7.5 (GLOVE) ×6 IMPLANT
GLOVE BIOGEL PI IND STRL 9 (GLOVE) ×1 IMPLANT
GLOVE BIOGEL PI INDICATOR 7.5 (GLOVE) ×6
GLOVE BIOGEL PI INDICATOR 9 (GLOVE) ×1
GLOVE SURG SYN 9.0  PF PI (GLOVE) ×2
GLOVE SURG SYN 9.0 PF PI (GLOVE) ×2 IMPLANT
GOWN SRG 2XL LVL 4 RGLN SLV (GOWNS) ×1 IMPLANT
GOWN STRL NON-REIN 2XL LVL4 (GOWNS) ×1
GOWN STRL REUS W/ TWL LRG LVL3 (GOWN DISPOSABLE) ×1 IMPLANT
GOWN STRL REUS W/TWL LRG LVL3 (GOWN DISPOSABLE) ×1
HOLDER FOLEY CATH W/STRAP (MISCELLANEOUS) ×2 IMPLANT
HOOD PEEL AWAY FLYTE STAYCOOL (MISCELLANEOUS) ×2 IMPLANT
KIT PREVENA INCISION MGT 13 (CANNISTER) ×2 IMPLANT
MAT BLUE FLOOR 46X72 FLO (MISCELLANEOUS) ×2 IMPLANT
NDL SAFETY ECLIPSE 18X1.5 (NEEDLE) ×1 IMPLANT
NEEDLE HYPO 18GX1.5 SHARP (NEEDLE) ×1
NEEDLE SPNL 18GX3.5 QUINCKE PK (NEEDLE) ×2 IMPLANT
NS IRRIG 1000ML POUR BTL (IV SOLUTION) ×2 IMPLANT
PACK HIP COMPR (MISCELLANEOUS) ×2 IMPLANT
SOL PREP PVP 2OZ (MISCELLANEOUS) ×2
SOLUTION PREP PVP 2OZ (MISCELLANEOUS) ×1 IMPLANT
SPONGE DRAIN TRACH 4X4 STRL 2S (GAUZE/BANDAGES/DRESSINGS) ×2 IMPLANT
STAPLER SKIN PROX 35W (STAPLE) ×2 IMPLANT
STRAP SAFETY BODY (MISCELLANEOUS) ×2 IMPLANT
SUT DVC 2 QUILL PDO  T11 36X36 (SUTURE) ×1
SUT DVC 2 QUILL PDO T11 36X36 (SUTURE) ×1 IMPLANT
SUT SILK 0 (SUTURE) ×1
SUT SILK 0 30XBRD TIE 6 (SUTURE) ×1 IMPLANT
SUT V-LOC 90 ABS DVC 3-0 CL (SUTURE) ×2 IMPLANT
SUT VIC AB 1 CT1 36 (SUTURE) ×2 IMPLANT
SYR 10ML 18GX1 1/2 (NEEDLE) ×2 IMPLANT
SYR 20CC LL (SYRINGE) ×2 IMPLANT
SYR 30ML LL (SYRINGE) ×2 IMPLANT
SYR BULB IRRIG 60ML STRL (SYRINGE) ×2 IMPLANT
TAPE MICROFOAM 4IN (TAPE) ×2 IMPLANT
TOWEL OR 17X26 4PK STRL BLUE (TOWEL DISPOSABLE) ×2 IMPLANT
TRAY FOLEY W/METER SILVER 16FR (SET/KITS/TRAYS/PACK) ×2 IMPLANT
WND VAC CANISTER 500ML (MISCELLANEOUS) ×2 IMPLANT

## 2017-04-22 NOTE — Anesthesia Post-op Follow-up Note (Signed)
Anesthesia QCDR form completed.        

## 2017-04-22 NOTE — H&P (Signed)
Reviewed paper H+P, will be scanned into chart. No changes noted.  

## 2017-04-22 NOTE — Anesthesia Postprocedure Evaluation (Signed)
Anesthesia Post Note  Patient: Stacy Conway  Procedure(s) Performed: TOTAL HIP ARTHROPLASTY ANTERIOR APPROACH (Left Hip)  Patient location during evaluation: PACU Anesthesia Type: General Level of consciousness: awake and alert and oriented Pain management: pain level controlled Vital Signs Assessment: post-procedure vital signs reviewed and stable Respiratory status: spontaneous breathing Cardiovascular status: blood pressure returned to baseline Anesthetic complications: no     Last Vitals:  Vitals:   04/22/17 1455 04/22/17 1601  BP: (!) 131/55 (!) 134/47  Pulse: 67 77  Resp: 16 18  Temp: 36.7 C (!) 36.4 C  SpO2: 98% 99%    Last Pain:  Vitals:   04/22/17 1601  TempSrc: Oral  PainSc:                  Derold Dorsch

## 2017-04-22 NOTE — Evaluation (Signed)
Physical Therapy Evaluation Patient Details Name: Stacy Conway MRN: [id number] DOB: 03-18-1935 Today's Date: 04/22/2017   History of Present Illness  Pt admitted for L hip THA. Pt is POD 0 at time of PT evaluation.  Clinical Impression  Pt is a pleasant 83 year old female who was admitted for L THA. Pt performs bed mobility with min assist, transfers with cga, and ambulation with cga and RW. Pt demonstrates deficits with strength/mobility/pain. Pt educated on WBing status prior to initiation of OOB mobility. Pt appears motivated to participate. Would benefit from skilled PT to address above deficits and promote optimal return to PLOF. Recommend transition to HHPT upon discharge from acute hospitalization.       Follow Up Recommendations Home health PT    Equipment Recommendations  Rolling walker with 5" wheels;3in1 (PT)    Recommendations for Other Services       Precautions / Restrictions Precautions Precautions: Fall;Anterior Hip Precaution Booklet Issued: No Restrictions Weight Bearing Restrictions: Yes LLE Weight Bearing: Weight bearing as tolerated      Mobility  Bed Mobility Overal bed mobility: Needs Assistance Bed Mobility: Supine to Sit     Supine to sit: Min assist     General bed mobility comments: needs cues for sequencing and sliding B LEs off bed. Needs assist for trunk support as well. Upright posture noted  Transfers Overall transfer level: Needs assistance Equipment used: Rolling walker (2 wheeled) Transfers: Sit to/from Stand Sit to Stand: Min guard         General transfer comment: slow transfer. Once standing, guarding noted on L LE.  Ambulation/Gait Ambulation/Gait assistance: Min guard Ambulation Distance (Feet): 3 Feet Assistive device: Rolling walker (2 wheeled) Gait Pattern/deviations: Step-to pattern     General Gait Details: ambulated to recliner with safe technique. Has difficulty taking step with L foot.   Stairs            Wheelchair Mobility    Modified Rankin (Stroke Patients Only)       Balance Overall balance assessment: Needs assistance Sitting-balance support: Feet supported Sitting balance-Leahy Scale: Good     Standing balance support: Bilateral upper extremity supported Standing balance-Leahy Scale: Good                               Pertinent Vitals/Pain Pain Assessment: Faces Faces Pain Scale: Hurts little more Pain Location: L hip  Pain Descriptors / Indicators: Operative site guarding Pain Intervention(s): Limited activity within patient's tolerance;Premedicated before session;Ice applied    Home Living Family/patient expects to be discharged to:: Private residence Living Arrangements: Spouse/significant other Available Help at Discharge: Family Type of Home: House Home Access: Stairs to enter Entrance Stairs-Rails: None Secretary/administrator of Steps: 1 Home Layout: One level Home Equipment: Cane - single point      Prior Function Level of Independence: Independent with assistive device(s)         Comments: household distances with SPC, husband assisted with IADLS     Hand Dominance        Extremity/Trunk Assessment   Upper Extremity Assessment Upper Extremity Assessment: Generalized weakness(B UE grossly 4/5)    Lower Extremity Assessment Lower Extremity Assessment: Generalized weakness(L LE grossly 3-/5; R LE grossly 3+/5)       Communication   Communication: No difficulties  Cognition Arousal/Alertness: Awake/alert Behavior During Therapy: WFL for tasks assessed/performed Overall Cognitive Status: Within Functional Limits for tasks assessed  General Comments      Exercises Other Exercises Other Exercises: supine ther-ex performed on L LE including ankle pumps, quad sets, hip abd/add, and glut sets. All ther-ex performed x 10 reps with min assist   Assessment/Plan    PT  Assessment Patient needs continued PT services  PT Problem List Decreased strength;Decreased activity tolerance;Decreased balance;Decreased mobility;Pain;Decreased knowledge of use of DME;Decreased safety awareness       PT Treatment Interventions DME instruction;Gait training;Stair training;Therapeutic exercise    PT Goals (Current goals can be found in the Care Plan section)  Acute Rehab PT Goals Patient Stated Goal: to get stronger PT Goal Formulation: With patient Time For Goal Achievement: 05/06/17 Potential to Achieve Goals: Good    Frequency BID   Barriers to discharge        Co-evaluation               AM-PAC PT "6 Clicks" Daily Activity  Outcome Measure Difficulty turning over in bed (including adjusting bedclothes, sheets and blankets)?: Unable Difficulty moving from lying on back to sitting on the side of the bed? : Unable Difficulty sitting down on and standing up from a chair with arms (e.g., wheelchair, bedside commode, etc,.)?: Unable Help needed moving to and from a bed to chair (including a wheelchair)?: A Little Help needed walking in hospital room?: A Little Help needed climbing 3-5 steps with a railing? : A Lot 6 Click Score: 11    End of Session Equipment Utilized During Treatment: Gait belt Activity Tolerance: Patient tolerated treatment well Patient left: in chair;with chair alarm set;with SCD's reapplied Nurse Communication: Mobility status PT Visit Diagnosis: Pain;Muscle weakness (generalized) (M62.81);Difficulty in walking, not elsewhere classified (R26.2) Pain - Right/Left: Left Pain - part of body: Hip    Time: [id number] PT Time Calculation (min) (ACUTE ONLY): 38 min   Charges:   PT Evaluation $PT Eval Low Complexity: 1 Low PT Treatments $Therapeutic Exercise: 8-22 mins $Therapeutic Activity: 8-22 mins   PT G Codes:        Stacy PalauStephanie Kurtiss Conway, PT, DPT [phone]   Stacy Conway 04/22/2017, 4:57 PM

## 2017-04-22 NOTE — [place] FL2 (Signed)
[place] MEDICAID FL2 LEVEL OF CARE SCREENING TOOL     IDENTIFICATION  Patient Name: Stacy Conway Birthdate: 1934-12-12 Sex: female Admission Date (Current Location): 04/22/2017  [place] and [place] Number:  Chiropodist and Address:  [facility], [address], [place], [place] [address]      Provider Number: [id number]  Attending Physician Name and Address:  Kennedy Bucker, MD  Relative Name and Phone Number:       Current Level of Care: Hospital Recommended Level of Care: Skilled Nursing Facility Prior Approval Number:    Date Approved/Denied:   PASRR Number: [id number])  Discharge Plan: SNF    Current Diagnoses: Patient Active Problem List   Diagnosis Date Noted  . Primary localized osteoarthritis of left hip 04/22/2017    Orientation RESPIRATION BLADDER Height & Weight     Self, Time, Situation, Place  O2(2L Nasal Cannula) Continent Weight: 222 lb (100.7 kg) Height:  5\' 3"  (160 cm)  BEHAVIORAL SYMPTOMS/MOOD NEUROLOGICAL BOWEL NUTRITION STATUS      Continent Diet(Clear liquid to be advanced)  AMBULATORY STATUS COMMUNICATION OF NEEDS Skin   Extensive Assist Verbally Surgical wounds(Incision Left Hip)                       Personal Care Assistance Level of Assistance  Bathing, Feeding, Dressing Bathing Assistance: Limited assistance Feeding assistance: Independent Dressing Assistance: Limited assistance     Functional Limitations Info  Sight, Hearing, Speech Sight Info: Adequate Hearing Info: Adequate Speech Info: Adequate    SPECIAL CARE FACTORS FREQUENCY  PT (By licensed PT), OT (By licensed OT)     PT Frequency: (5) OT Frequency: (5)            Contractures      Additional Factors Info  Code Status, Allergies Code Status Info: (Full Code) Allergies Info: (LEVOFLOXACIN, ASPIRIN, NSAIDS, SCALLOPS SHELLFISH ALLERGY, PENICILLINS, SULFA ANTIBIOTICS )           Current Medications (04/22/2017):   This is the current hospital active medication list Current Facility-Administered Medications  Medication Dose Route Frequency Provider Last Rate Last Dose  . 0.9 %  sodium chloride infusion   Intravenous Continuous Kennedy Bucker, MD 75 mL/hr at 04/22/17 1512    . acetaminophen (TYLENOL) tablet 650 mg  650 mg Oral Q4H PRN Kennedy Bucker, MD       Or  . acetaminophen (TYLENOL) suppository 650 mg  650 mg Rectal Q4H PRN Kennedy Bucker, MD      . alum & mag hydroxide-simeth (MAALOX/MYLANTA) 200-200-20 MG/5ML suspension 30 mL  30 mL Oral Q4H PRN Kennedy Bucker, MD      . amLODipine (NORVASC) tablet 10 mg  10 mg Oral QPM Kennedy Bucker, MD      . apixaban Everlene Balls) tablet 5 mg  5 mg Oral BID Kennedy Bucker, MD      . atenolol (TENORMIN) tablet 25 mg  25 mg Oral QPM Kennedy Bucker, MD      . bisacodyl (DULCOLAX) suppository 10 mg  10 mg Rectal Daily PRN Kennedy Bucker, MD      . clindamycin (CLEOCIN) IVPB 900 mg  900 mg Intravenous Q6H Kennedy Bucker, MD 100 mL/hr at 04/22/17 1512 900 mg at 04/22/17 1512  . diphenhydrAMINE (BENADRYL) 12.5 MG/5ML elixir 12.5-25 mg  12.5-25 mg Oral Q4H PRN Kennedy Bucker, MD      . docusate sodium (COLACE) capsule 100 mg  100 mg Oral BID Kennedy Bucker, MD      .  famotidine (PEPCID) 20 MG tablet           . fentaNYL (SUBLIMAZE) 100 MCG/2ML injection           . furosemide (LASIX) tablet 40 mg  40 mg Oral Daily PRN Kennedy BuckerMenz, Michael, MD      . lisinopril (PRINIVIL,ZESTRIL) tablet 20 mg  20 mg Oral QPM Kennedy BuckerMenz, Michael, MD      . loratadine (CLARITIN) tablet 10 mg  10 mg Oral QHS Kennedy BuckerMenz, Michael, MD      . magnesium citrate solution 1 Bottle  1 Bottle Oral Once PRN Kennedy BuckerMenz, Michael, MD      . magnesium hydroxide (MILK OF MAGNESIA) suspension 30 mL  30 mL Oral Daily PRN Kennedy BuckerMenz, Michael, MD      . menthol-cetylpyridinium (CEPACOL) lozenge 3 mg  1 lozenge Oral PRN Kennedy BuckerMenz, Michael, MD       Or  . phenol (CHLORASEPTIC) mouth spray 1 spray  1 spray Mouth/Throat PRN Kennedy BuckerMenz, Michael, MD      .  methocarbamol (ROBAXIN) tablet 500 mg  500 mg Oral Q6H PRN Kennedy BuckerMenz, Michael, MD       Or  . methocarbamol (ROBAXIN) 500 mg in dextrose 5 % 50 mL IVPB  500 mg Intravenous Q6H PRN Kennedy BuckerMenz, Michael, MD      . metoCLOPramide (REGLAN) tablet 5-10 mg  5-10 mg Oral Q8H PRN Kennedy BuckerMenz, Michael, MD       Or  . metoCLOPramide (REGLAN) injection 5-10 mg  5-10 mg Intravenous Q8H PRN Kennedy BuckerMenz, Michael, MD      . morphine 4 MG/ML injection 2 mg  2 mg Intravenous Q1H PRN Kennedy BuckerMenz, Michael, MD   2 mg at 04/22/17 1455  . ondansetron (ZOFRAN) tablet 4 mg  4 mg Oral Q6H PRN Kennedy BuckerMenz, Michael, MD       Or  . ondansetron [facility](ZOFRAN) injection 4 mg  4 mg Intravenous Q6H PRN Kennedy BuckerMenz, Michael, MD      . oxyCODONE (Oxy IR/ROXICODONE) immediate release tablet 10 mg  10 mg Oral Q3H PRN Kennedy BuckerMenz, Michael, MD      . oxyCODONE (Oxy IR/ROXICODONE) immediate release tablet 5 mg  5 mg Oral Q3H PRN Kennedy BuckerMenz, Michael, MD      . simvastatin (ZOCOR) tablet 20 mg  20 mg Oral QPM Kennedy BuckerMenz, Michael, MD      . zolpidem (AMBIEN) tablet 5 mg  5 mg Oral QHS PRN Kennedy BuckerMenz, Michael, MD         Discharge Medications: Please see discharge summary for a list of discharge medications.  Relevant Imaging Results:  Relevant Lab Results:   Additional Information (SSN: [id number])  Payton SparkAnanda A Daleigh Pollinger, Student-Social Work

## 2017-04-22 NOTE — Anesthesia Preprocedure Evaluation (Addendum)
Anesthesia Evaluation  Patient identified by MRN, date of birth, ID band Patient awake    Reviewed: Allergy & Precautions, NPO status , Patient's Chart, lab work & pertinent test results  Airway Mallampati: III  TM Distance: <3 FB     Dental  (+) Chipped, Caps   Pulmonary sleep apnea , former smoker,    Pulmonary exam normal        Cardiovascular hypertension, Normal cardiovascular exam     Neuro/Psych negative neurological ROS  negative psych ROS   GI/Hepatic negative GI ROS, Neg liver ROS,   Endo/Other  negative endocrine ROS  Renal/GU Renal InsufficiencyRenal disease  negative genitourinary   Musculoskeletal  (+) Arthritis , Osteoarthritis,    Abdominal Normal abdominal exam  (+)   Peds negative pediatric ROS (+)  Hematology negative hematology ROS (+)   Anesthesia Other Findings   Reproductive/Obstetrics                            Anesthesia Physical Anesthesia Plan  ASA: III  Anesthesia Plan:    Post-op Pain Management:    Induction: Intravenous  PONV Risk Score and Plan:   Airway Management Planned:   Additional Equipment:   Intra-op Plan:   Post-operative Plan:   Informed Consent:   Plan Discussed with:   Anesthesia Plan Comments:         Anesthesia Quick Evaluation

## 2017-04-22 NOTE — Progress Notes (Signed)
Holding in Same Day until floor bed is ready. Tolerating sips of gingerale.

## 2017-04-22 NOTE — Op Note (Signed)
04/22/2017  9:21 AM  PATIENT:  Stacy Conway  83 y.o. female  PRE-OPERATIVE DIAGNOSIS:  primary osteoarthritis of left hip  POST-OPERATIVE DIAGNOSIS:  primary osteoarthritis of left hip  PROCEDURE:  Procedure(s): TOTAL HIP ARTHROPLASTY ANTERIOR APPROACH (Left)  SURGEON: Leitha SchullerMichael J Beadie Matsunaga, MD  ASSISTANTS: None  ANESTHESIA:   general  EBL:  Total I/O In: 800 [I.V.:800] Out: 450 [Urine:300; Blood:150]  BLOOD ADMINISTERED:none  DRAINS: (2) Hemovact drain(s) in the Subcutaneous layer with  Suction Open   LOCAL MEDICATIONS USED:  MARCAINE     SPECIMEN:  Source of Specimen:  Left femoral head  DISPOSITION OF SPECIMEN:  PATHOLOGY  COUNTS:  YES  TOURNIQUET:  * No tourniquets in log *  IMPLANTS: Medacta Amis 3 standard STEM with 54 mm Mpact cup DM, lot 54 mm liner and S 28 mm metal head  DICTATION: .Dragon Dictation   The patient was brought to the operating room and after spinal anesthesia was obtained patient was placed on the operative table with the ipsilateral foot into the Medacta attachment, contralateral leg on a well-padded table. C-arm was brought in and preop template x-ray taken. After prepping and draping in usual sterile fashion appropriate patient identification and timeout procedures were completed. Anterior approach to the hip was obtained and centered over the greater trochanter and TFL muscle. The subcutaneous tissue was incised hemostasis being achieved by electrocautery. TFL fascia was incised and the muscle retracted laterally deep retractor placed. The lateral femoral circumflex vessels were identified and ligated. The anterior capsule was exposed and a capsulotomy performed. The neck was identified and a femoral neck cut carried out with a saw. The head was removed without difficulty and showed sclerotic femoral head and acetabulum. Reaming was carried out to 52 mm and a 54 mm cup trial gave appropriate tightness to the acetabular component a 54 DM cup was impacted  into position. The leg was then externally rotated and ischiofemoral and pubofemoral releases carried out. The femur was sequentially broached to a size 3, size 3 standard width S head trials were placed and the final components chosen. The 3 standard stem was inserted along with a S 28 mm head and 54 mm liner. The hip was reduced and was stable the wound was thoroughly irrigated with fibrillar placed along the posterior capsulotomy and medial neck for aid in hemostasis. The deep fascia was closed using a heavy Quill after infiltration of 30 cc of quarter percent Sensorcaine with epinephrine. Subcutaneous drains were then inserted, 3-0 v-locl to close the skin with skin staples and incisional wound VAC applied PLAN OF CARE: Admit to inpatient

## 2017-04-22 NOTE — Anesthesia Procedure Notes (Signed)
Procedure Name: Intubation Performed by: Mathews ArgyleLogan, Bruchy Mikel, CRNA Pre-anesthesia Checklist: Patient identified, Patient being monitored, Timeout performed, Emergency Drugs available and Suction available Patient Re-evaluated:Patient Re-evaluated prior to induction Oxygen Delivery Method: Circle system utilized Preoxygenation: Pre-oxygenation with 100% oxygen Induction Type: IV induction Ventilation: Mask ventilation without difficulty Laryngoscope Size: 3 and McGraph Grade View: Grade III Tube type: Oral Tube size: 7.0 mm Number of attempts: 2 Airway Equipment and Method: Stylet,  Video-laryngoscopy and Bougie stylet Placement Confirmation: ETT inserted through vocal cords under direct vision,  positive ETCO2 and breath sounds checked- equal and bilateral Secured at: 21 cm Tube secured with: Tape Dental Injury: Teeth and Oropharynx as per pre-operative assessment  Difficulty Due To: Difficulty was anticipated, Difficult Airway- due to reduced neck mobility and Difficult Airway- due to anterior larynx Future Recommendations: Recommend- induction with short-acting agent, and alternative techniques readily available

## 2017-04-22 NOTE — Transfer of Care (Signed)
Immediate Anesthesia Transfer of Care Note  Patient: Stacy PihJudith Dura  Procedure(s) Performed: TOTAL HIP ARTHROPLASTY ANTERIOR APPROACH (Left Hip)  Patient Location: PACU  Anesthesia Type:General  Level of Consciousness: awake  Airway & Oxygen Therapy: Patient Spontanous Breathing and Patient connected to face mask oxygen  Post-op Assessment: Report given to RN and Post -op Vital signs reviewed and stable  Post vital signs: Reviewed  Last Vitals:  Vitals:   04/22/17 0652 04/22/17 0919  BP: (!) 159/67 139/63  Pulse: 66 76  Resp: 16 12  Temp: 36.9 C   SpO2: 100% 95%    Last Pain:  Vitals:   04/22/17 0652  TempSrc: Oral  PainSc: 6          Complications: No apparent anesthesia complications

## 2017-04-22 NOTE — OR Nursing (Signed)
Received call from [employer] - spoke with Abran DukeFrancesca- requesting for OR to remove blood bracelet number  [id number]. Removed bracelet per request.

## 2017-04-23 ENCOUNTER — Encounter: Payer: Self-pay | Admitting: Orthopedic Surgery

## 2017-04-23 LAB — CBC
HCT: 30.6 % — ABNORMAL LOW (ref 35.0–47.0)
Hemoglobin: 10 g/dL — ABNORMAL LOW (ref 12.0–16.0)
MCH: 30.2 pg (ref 26.0–34.0)
MCHC: 32.6 g/dL (ref 32.0–36.0)
MCV: 92.5 fL (ref 80.0–100.0)
PLATELETS: 215 10*3/uL (ref 150–440)
RBC: 3.31 MIL/uL — ABNORMAL LOW (ref 3.80–5.20)
RDW: 14.2 % (ref 11.5–14.5)
WBC: 10.3 10*3/uL (ref 3.6–11.0)

## 2017-04-23 LAB — BASIC METABOLIC PANEL
Anion gap: 9 (ref 5–15)
BUN: 30 mg/dL — AB (ref 6–20)
CALCIUM: 8.3 mg/dL — AB (ref 8.9–10.3)
CO2: 22 mmol/L (ref 22–32)
CREATININE: 1.08 mg/dL — AB (ref 0.44–1.00)
Chloride: 107 mmol/L (ref 101–111)
GFR calc Af Amer: 55 mL/min — ABNORMAL LOW (ref >60)
GFR, EST NON AFRICAN AMERICAN: 47 mL/min — AB (ref >60)
GLUCOSE: 135 mg/dL — AB (ref 65–99)
Potassium: 5.3 mmol/L — ABNORMAL HIGH (ref 3.5–5.1)
Sodium: 138 mmol/L (ref 135–145)

## 2017-04-23 MED ORDER — FE FUMARATE-B12-VIT C-FA-IFC PO CAPS
1.0000 | ORAL_CAPSULE | Freq: Two times a day (BID) | ORAL | Status: DC
  Administered 2017-04-23 – 2017-04-25 (×5): 1 via ORAL
  Filled 2017-04-23 (×5): qty 1

## 2017-04-23 NOTE — Progress Notes (Signed)
Pt placed on home Cpap with nasal mask. No distress noted.

## 2017-04-23 NOTE — Clinical Social Work Note (Signed)
Clinical Social Work Assessment  Patient Details  Name: Stacy Conway MRN: [id number] Date of Birth: 05/27/1934  Date of referral:  04/23/17               Reason for consult:  Facility Placement                Permission sought to share information with:  Chartered certified accountant granted to share information::  Yes, Verbal Permission Granted  Name::      [place]::   [place]   Relationship::     Contact Information:     Housing/Transportation Living arrangements for the past 2 months:  Time of Information:  Patient, Spouse Patient Interpreter Needed:  None Criminal Activity/Legal Involvement Pertinent to Current Situation/Hospitalization:  No - Comment as needed Significant Relationships:  Adult Children, Spouse Lives with:  Spouse Do you feel safe going back to the place where you live?  Yes Need for family participation in patient care:  Yes (Comment)  Care giving concerns:  Patient lives in [place] with her husband Stacy Conway.    Social Worker assessment / plan:  Holiday representative (CSW) received SNF consult. PT is recommencing SNF. CSW met with patient while Dr. Rudene Christians was at bedside. CSW introduced self and explained role of CSW department. Patient was alert and oriented X4 and was laying in the bed. CSW introduced self and explained role of CSW department. Patient reported that she lives in [place] with her husband. CSW explained SNF process and that medicare requires a 3 night qualfying inpatient stay in a hospital in order to pay for SNF. Patient was admitted to inpatient on 04/22/17. Patient verbalized her understanding and is agreeable to SNF search in [place]. FL2 complete and faxed out.   CSW presented bed offers to patient and she chose [facility]. CSW contacted patient's husband Stacy Conway and made him aware of above. Stacy Conway is in agreement with D/C plan. Plan is for patient to D/C to [facility] Sunday 04/25/17  pending medical clearance. Dr. Rudene Christians is aware of above. [facility] liaison is aware of above. CSW will continue to follow and assist as needed.   Employment status:  Retired Forensic scientist:  Medicare PT Recommendations:  [place] / Referral to community resources:  [place]  Patient/Family's Response to care:  Patient is agreeable to D/C to [facility] for short term rehab.   Patient/Family's Understanding of and Emotional Response to Diagnosis, Current Treatment, and Prognosis:  Patient and her husband were very pleasant and thanked CSW for assistance.   Emotional Assessment Appearance:  Appears stated age Attitude/Demeanor/Rapport:    Affect (typically observed):  Accepting, Adaptable, Pleasant Orientation:  Oriented to Self, Oriented to Place, Oriented to  Time, Oriented to Situation Alcohol / Substance use:  Not Applicable Psych involvement (Current and /or in the community):  No (Comment)  Discharge Needs  Concerns to be addressed:  Discharge Planning Concerns Readmission within the last 30 days:  No Current discharge risk:  Dependent with Mobility Barriers to Discharge:  Continued Medical Work up   [employer], [place] Beets, LCSW 04/23/2017, 2:40 PM

## 2017-04-23 NOTE — Clinical Social Work Placement (Signed)
   CLINICAL SOCIAL WORK PLACEMENT  NOTE  Date:  04/23/2017  Patient Details  Name: Stacy Conway MRN: [id number] Date of Birth: 05/17/1934  Clinical Social Work is seeking post-discharge placement for this patient at the Skilled  Nursing Facility level of care (*CSW will initial, date and re-position this form in  chart as items are completed):  Yes   Patient/family provided with [place] Clinical Social Work Department's list of facilities offering this level of care within the geographic area requested by the patient (or if unable, by the patient's family).  Yes   Patient/family informed of their freedom to choose among providers that offer the needed level of care, that participate in Medicare, Medicaid or managed care program needed by the patient, have an available bed and are willing to accept the patient.  Yes   Patient/family informed of 's ownership interest in [facility] and [facility], as well as of the fact that they are under no obligation to receive care at these facilities.  PASRR submitted to EDS on 04/22/17     PASRR number received on 04/22/17     Existing PASRR number confirmed on       FL2 transmitted to all facilities in geographic area requested by pt/family on 04/23/17     FL2 transmitted to all facilities within larger geographic area on       Patient informed that his/her managed care company has contracts with or will negotiate with certain facilities, including the following:        Yes   Patient/family informed of bed offers received.  Patient chooses bed at ([facility] )     Physician recommends and patient chooses bed at      Patient to be transferred to   on  .  Patient to be transferred to facility by       Patient family notified on   of transfer.  Name of family member notified:        PHYSICIAN       Additional Comment:    _______________________________________________ Chyann Ambrocio, Darleen CrockerBailey M, LCSW 04/23/2017, 2:38  PM

## 2017-04-23 NOTE — Progress Notes (Addendum)
   Subjective: 1 Day Post-Op Procedure(s) (LRB): TOTAL HIP ARTHROPLASTY ANTERIOR APPROACH (Left) Patient reports pain as 4 on 0-10 scale.   Patient is well, and has had no acute complaints or problems Denies any CP, SOB, ABD pain. We will continue therapy today.  Plan is to go Home after hospital stay.  Objective: Vital signs in last 24 hours: Temp:  [96.9 F (36.1 C)-98.6 F (37 C)] 98.6 F (37 C) (01/18 0538) Pulse Rate:  [65-82] 70 (01/18 0538) Resp:  [8-23] 18 (01/18 0538) BP: (99-162)/(40-95) 134/50 (01/18 0538) SpO2:  [91 %-99 %] 94 % (01/18 0538)  Intake/Output from previous day: 01/17 0701 - 01/18 0700 In: 4268.8 [P.O.:1500; I.V.:2568.8; IV Piggyback:200] Out: 1970 [Urine:1820; Blood:150] Intake/Output this shift: No intake/output data recorded.  Recent Labs    04/23/17 0315  HGB 10.0*   Recent Labs    04/23/17 0315  WBC 10.3  RBC 3.31*  HCT 30.6*  PLT 215   Recent Labs    04/23/17 0315  NA 138  K 5.3*  CL 107  CO2 22  BUN 30*  CREATININE 1.08*  GLUCOSE 135*  CALCIUM 8.3*   No results for input(s): LABPT, INR in the last 72 hours.  EXAM General - Patient is Alert, Appropriate and Oriented Extremity - Neurovascular intact Sensation intact distally Intact pulses distally Dorsiflexion/Plantar flexion intact No cellulitis present Compartment soft Dressing - hemovac and wound vac intact Motor Function - intact, moving foot and toes well on exam.   Past Medical History:  Diagnosis Date  . A-fib (HCC)   . Arthritis   . High cholesterol   . Hypertension   . Renal disorder    ckd  . Sleep apnea     Assessment/Plan:   1 Day Post-Op Procedure(s) (LRB): TOTAL HIP ARTHROPLASTY ANTERIOR APPROACH (Left) Active Problems:   Primary localized osteoarthritis of left hip   Acute post op blood loss anemia   Estimated body mass index is 39.33 kg/m as calculated from the following:   Height as of this encounter: 5\' 3"  (1.6 m).   Weight as of  this encounter: 100.7 kg (222 lb). Advance diet Up with therapy  Needs bowel movement Acute post op blood loss anemia - Start Iron. Recheck labs in the morning Plan on discharge to home with home health PT Saturday or Sunday pending progress with PT Remove Hemovac Saturday   DVT Prophylaxis - Foot Pumps and TED hose, Eliquis Weight-Bearing as tolerated to left leg   T. Cranston Neighborhris Gaines, PA-C [facility] 04/23/2017, 8:11 AM

## 2017-04-23 NOTE — Care Management (Signed)
RNCM spoke with patient regarding transition of care. She plans to return home with her Husband. He drives and she also has a daughter that can assist with transportation.  At baseline patient uses cane but will need front-wheeled walker. [facility].  Patient prefers to use Home health agency recommended by Dr. Rosita KeaMenz- [facility].  I have notified Dorene SorrowJerry with [facility] of this request. She uses Eliquis so Lovenox has not been called in.

## 2017-04-23 NOTE — Progress Notes (Signed)
Physical Therapy Treatment Patient Details Name: Stacy PihJudith Conway MRN: [id number] DOB: 06/19/1934 Today's Date: 04/23/2017    History of Present Illness Pt admitted for L hip THA.     PT Comments    Pt is making good progress towards goals with increased distance this session, however still requires significant cues for sequencing. Very anxious. Good endurance with there-ex and motivated to participate. WIll continue to progress.   Follow Up Recommendations  SNF     Equipment Recommendations  Rolling walker with 5" wheels    Recommendations for Other Services       Precautions / Restrictions Precautions Precautions: Fall;Anterior Hip Precaution Booklet Issued: Yes (comment) Restrictions Weight Bearing Restrictions: Yes LLE Weight Bearing: Weight bearing as tolerated    Mobility  Bed Mobility Overal bed mobility: Needs Assistance Bed Mobility: Supine to Sit;Sit to Supine     Supine to sit: Mod assist Sit to supine: Max assist   General bed mobility comments: demonstrates poor carryover with cues and sequencing. Needs increased time for transitions.  Transfers Overall transfer level: Needs assistance Equipment used: Rolling walker (2 wheeled) Transfers: Sit to/from Stand Sit to Stand: Min assist         General transfer comment: improved technique with standing and using RW. Cues to push from seated surface. Pt very anxious regarding mobility  Ambulation/Gait Ambulation/Gait assistance: Min assist Ambulation Distance (Feet): 40 Feet Assistive device: Rolling walker (2 wheeled) Gait Pattern/deviations: Step-to pattern     General Gait Details: Improved technique noted, however very small steps and takes heavy encouragment due to anxiety. 1 seated rest break required as she fatigues quickly.   Stairs            Wheelchair Mobility    Modified Rankin (Stroke Patients Only)       Balance                                             Cognition Arousal/Alertness: Awake/alert Behavior During Therapy: WFL for tasks assessed/performed Overall Cognitive Status: Within Functional Limits for tasks assessed                                        Exercises Other Exercises Other Exercises: supine ther-ex performed on L LE including ankle pumps, SAQ, quad sets, hip abd/add, and glut sets. All ther-ex performed x 12 reps with min assist Other Exercises: [facility] assist, needs assist for physical hygiene.    General Comments        Pertinent Vitals/Pain Pain Assessment: 0-10 Pain Score: 5  Pain Location: L hip  Pain Descriptors / Indicators: Operative site guarding Pain Intervention(s): Limited activity within patient's tolerance    Home Living                      Prior Function            PT Goals (current goals can now be found in the care plan section) Acute Rehab PT Goals Patient Stated Goal: to get stronger PT Goal Formulation: With patient Time For Goal Achievement: 05/06/17 Potential to Achieve Goals: Good Progress towards PT goals: Progressing toward goals    Frequency    BID      PT Plan Current plan remains appropriate  Co-evaluation              AM-PAC PT "6 Clicks" Daily Activity  Outcome Measure  Difficulty turning over in bed (including adjusting bedclothes, sheets and blankets)?: Unable Difficulty moving from lying on back to sitting on the side of the bed? : Unable Difficulty sitting down on and standing up from a chair with arms (e.g., wheelchair, bedside commode, etc,.)?: Unable Help needed moving to and from a bed to chair (including a wheelchair)?: A Lot Help needed walking in hospital room?: A Lot Help needed climbing 3-5 steps with a railing? : A Lot 6 Click Score: 9    End of Session Equipment Utilized During Treatment: Gait belt Activity Tolerance: Patient tolerated treatment well Patient left: with SCD's  reapplied;in bed;with bed alarm set Nurse Communication: Mobility status PT Visit Diagnosis: Pain;Muscle weakness (generalized) (M62.81);Difficulty in walking, not elsewhere classified (R26.2) Pain - Right/Left: Left Pain - part of body: Hip     Time: 1355-1450 PT Time Calculation (min) (ACUTE ONLY): 55 min  Charges:  $Gait Training: 23-37 mins $Therapeutic Exercise: 23-37 mins                    G Codes:       Elizabeth Palau, PT, DPT [phone]    Cas Tracz 04/23/2017, 5:09 PM

## 2017-04-23 NOTE — Progress Notes (Signed)
Physical Therapy Treatment Patient Details Name: Stacy Conway MRN: [id number] DOB: 08-Aug-1934 Today's Date: 04/23/2017    History of Present Illness Pt admitted for L hip THA.     PT Comments    Pt is making very slow progress and requires increased physical assist this date for all mobility. Pt continues to be motivated to participate, however acknowledges deficits. Needs sequencing for every step during movement process and demonstrates little to no carryover from previous session. Changing recommendation to SNF at this time secondary to weakness and safety concerns. Pt agreeable. CSW notified.   Follow Up Recommendations  SNF     Equipment Recommendations  Rolling walker with 5" wheels    Recommendations for Other Services       Precautions / Restrictions Precautions Precautions: Fall;Anterior Hip Precaution Booklet Issued: Yes (comment) Restrictions Weight Bearing Restrictions: Yes LLE Weight Bearing: Weight bearing as tolerated    Mobility  Bed Mobility Overal bed mobility: Needs Assistance Bed Mobility: Supine to Sit     Supine to sit: Mod assist;Max assist     General bed mobility comments: needs heavy assist for initiating sliding B LEs off bed as well as max assist for trunk control. Once seated at EOB, able to sit with upright posture, however takes extended time for position change.  Transfers Overall transfer level: Needs assistance Equipment used: Rolling walker (2 wheeled) Transfers: Sit to/from Stand Sit to Stand: Mod assist         General transfer comment: slow technique. Once standing, able to stand with cga. Post leaning noted, min assist for correction.  Ambulation/Gait Ambulation/Gait assistance: Mod assist Ambulation Distance (Feet): 10 Feet Assistive device: Rolling walker (2 wheeled) Gait Pattern/deviations: Step-to pattern     General Gait Details: decreased WBing noted on L hip with very small steps. Heavy sequencing for every  movement. Fatigues quickly and reports increased pain with increased distance   Stairs            Wheelchair Mobility    Modified Rankin (Stroke Patients Only)       Balance                                            Cognition Arousal/Alertness: Awake/alert Behavior During Therapy: WFL for tasks assessed/performed Overall Cognitive Status: Within Functional Limits for tasks assessed                                        Exercises Other Exercises Other Exercises: supine ther-ex performed on L LE including ankle pumps, SAQ, quad sets, hip abd/add, and glut sets. All ther-ex performed x 12 reps with min assist    General Comments        Pertinent Vitals/Pain Pain Assessment: Faces Faces Pain Scale: Hurts little more Pain Location: L hip  Pain Descriptors / Indicators: Operative site guarding Pain Intervention(s): Limited activity within patient's tolerance;Repositioned;Premedicated before session    Home Living                      Prior Function            PT Goals (current goals can now be found in the care plan section) Acute Rehab PT Goals Patient Stated Goal: to get stronger PT Goal Formulation: With patient Time  For Goal Achievement: 05/06/17 Potential to Achieve Goals: Good Progress towards PT goals: Progressing toward goals    Frequency    BID      PT Plan Discharge plan needs to be updated    Co-evaluation              AM-PAC PT "6 Clicks" Daily Activity  Outcome Measure  Difficulty turning over in bed (including adjusting bedclothes, sheets and blankets)?: Unable Difficulty moving from lying on back to sitting on the side of the bed? : Unable Difficulty sitting down on and standing up from a chair with arms (e.g., wheelchair, bedside commode, etc,.)?: Unable Help needed moving to and from a bed to chair (including a wheelchair)?: A Lot Help needed walking in hospital room?: A Lot Help  needed climbing 3-5 steps with a railing? : A Lot 6 Click Score: 9    End of Session Equipment Utilized During Treatment: Gait belt Activity Tolerance: Patient tolerated treatment well Patient left: in chair;with chair alarm set;with SCD's reapplied Nurse Communication: Mobility status PT Visit Diagnosis: Pain;Muscle weakness (generalized) (M62.81);Difficulty in walking, not elsewhere classified (R26.2) Pain - Right/Left: Left Pain - part of body: Hip     Time: [id number] PT Time Calculation (min) (ACUTE ONLY): 38 min  Charges:  $Gait Training: 23-37 mins $Therapeutic Exercise: 8-22 mins                    G Codes:       Elizabeth PalauStephanie Sahra Converse, PT, DPT [phone]    Stacy Conway 04/23/2017, 10:44 AM

## 2017-04-24 LAB — BASIC METABOLIC PANEL
ANION GAP: 11 (ref 5–15)
BUN: 28 mg/dL — ABNORMAL HIGH (ref 6–20)
CHLORIDE: 103 mmol/L (ref 101–111)
CO2: 19 mmol/L — AB (ref 22–32)
Calcium: 8.2 mg/dL — ABNORMAL LOW (ref 8.9–10.3)
Creatinine, Ser: 1.13 mg/dL — ABNORMAL HIGH (ref 0.44–1.00)
GFR calc Af Amer: 52 mL/min — ABNORMAL LOW (ref >60)
GFR calc non Af Amer: 45 mL/min — ABNORMAL LOW (ref >60)
GLUCOSE: 119 mg/dL — AB (ref 65–99)
POTASSIUM: 4.5 mmol/L (ref 3.5–5.1)
Sodium: 133 mmol/L — ABNORMAL LOW (ref 135–145)

## 2017-04-24 LAB — CBC
HEMATOCRIT: 30.3 % — AB (ref 35.0–47.0)
HEMOGLOBIN: 10 g/dL — AB (ref 12.0–16.0)
MCH: 30.3 pg (ref 26.0–34.0)
MCHC: 33.1 g/dL (ref 32.0–36.0)
MCV: 91.7 fL (ref 80.0–100.0)
Platelets: 201 10*3/uL (ref 150–440)
RBC: 3.31 MIL/uL — AB (ref 3.80–5.20)
RDW: 13.8 % (ref 11.5–14.5)
WBC: 9.1 10*3/uL (ref 3.6–11.0)

## 2017-04-24 MED ORDER — OXYCODONE HCL 5 MG PO TABS: 5.0000 mg | ORAL_TABLET | ORAL | 0 refills | Status: DC | PRN

## 2017-04-24 NOTE — Progress Notes (Signed)
Pt uses CPAP at home at bedtime for sleep apnea. Pt has her own machine at bedside. Dr. Joice LoftsPoggi paged for a CPAP order at bedtime. Respiratory therapist made aware.

## 2017-04-24 NOTE — Plan of Care (Signed)
  Progressing Education: Knowledge of General Education information will improve 04/24/2017 1111 - Progressing by Greer Eeampbell, Zynasia Burklow C, RN Health Behavior/Discharge Planning: Ability to manage health-related needs will improve 04/24/2017 1111 - Progressing by Greer Eeampbell, Maxwell Lemen C, RN Clinical Measurements: Ability to maintain clinical measurements within normal limits will improve 04/24/2017 1111 - Progressing by Greer Eeampbell, Maclaine Ahola C, RN Will remain free from infection 04/24/2017 1111 - Progressing by Greer Eeampbell, Kenta Laster C, RN Diagnostic test results will improve 04/24/2017 1111 - Progressing by Greer Eeampbell, Dannielle Baskins C, RN Respiratory complications will improve 04/24/2017 1111 - Progressing by Greer Eeampbell, Daneesha Quinteros C, RN Cardiovascular complication will be avoided 04/24/2017 1111 - Progressing by Greer Eeampbell, Glorian Mcdonell C, RN Activity: Risk for activity intolerance will decrease 04/24/2017 1111 - Progressing by Greer Eeampbell, Casey Fye C, RN Nutrition: Adequate nutrition will be maintained 04/24/2017 1111 - Progressing by Greer Eeampbell, Kazoua Gossen C, RN Coping: Level of anxiety will decrease 04/24/2017 1111 - Progressing by Greer Eeampbell, Chinita Schimpf C, RN Elimination: Will not experience complications related to bowel motility 04/24/2017 1111 - Progressing by Greer Eeampbell, Rayel Santizo C, RN Will not experience complications related to urinary retention 04/24/2017 1111 - Progressing by Greer Eeampbell, Itha Kroeker C, RN Pain Managment: General experience of comfort will improve 04/24/2017 1111 - Progressing by Greer Eeampbell, Nona Gracey C, RN Safety: Ability to remain free from injury will improve 04/24/2017 1111 - Progressing by Greer Eeampbell, Erice Ahles C, RN Skin Integrity: Risk for impaired skin integrity will decrease 04/24/2017 1111 - Progressing by Greer Eeampbell, Alonni Heimsoth C, RN

## 2017-04-24 NOTE — Progress Notes (Signed)
Physical Therapy Treatment Patient Details Name: Stacy PihJudith Polhamus MRN: [id number] DOB: 12/25/1934 Today's Date: 04/24/2017    History of Present Illness Pt admitted for L hip THA.     PT Comments    Pt agreeable to PT. Pain 7/10 left hip. Pt continues to demonstrate effortful transfers/ambulation, but improved distance of ambulation this session. Pt fatigued post toileting/transfers and ambulation and wishes to return to bed to rest. Continue PT to progress strength, endurance to improve functional mobility.    Follow Up Recommendations  SNF     Equipment Recommendations  Rolling walker with 5" wheels(already received)    Recommendations for Other Services       Precautions / Restrictions Precautions Precautions: Fall;Anterior Hip Restrictions Weight Bearing Restrictions: Yes LLE Weight Bearing: Weight bearing as tolerated    Mobility  Bed Mobility Overal bed mobility: Needs Assistance Bed Mobility: Sit to Supine     Supine to sit: Mod assist Sit to supine: Mod assist   General bed mobility comments: LEs  Transfers Overall transfer level: Needs assistance Equipment used: Rolling walker (2 wheeled) Transfers: Sit to/from Stand Sit to Stand: Min assist         General transfer comment: multiple surfaces  Ambulation/Gait Ambulation/Gait assistance: Min guard Ambulation Distance (Feet): 16 Feet Assistive device: Rolling walker (2 wheeled) Gait Pattern/deviations: Step-to pattern;Trunk flexed   Gait velocity interpretation: <1.8 ft/sec, indicative of risk for recurrent falls General Gait Details: Small guarded steps; poor posture   Stairs            Wheelchair Mobility    Modified Rankin (Stroke Patients Only)       Balance Overall balance assessment: Needs assistance Sitting-balance support: Feet supported Sitting balance-Leahy Scale: Good     Standing balance support: Bilateral upper extremity supported Standing balance-Leahy Scale: Fair                               Cognition Arousal/Alertness: Awake/alert Behavior During Therapy: WFL for tasks assessed/performed Overall Cognitive Status: Within Functional Limits for tasks assessed                                        Exercises Total Joint Exercises Ankle Circles/Pumps: AROM;Both;20 reps Quad Sets: Strengthening;Both;20 reps Gluteal Sets: Strengthening;Both;20 reps Heel Slides: AAROM;Left;10 reps Hip ABduction/ADduction: AAROM;Left;10 reps Other Exercises Other Exercises: set up for toileting; SBA for personal hygiene in stand    General Comments        Pertinent Vitals/Pain Pain Assessment: 0-10 Pain Score: 7  Pain Location: L hip  Pain Intervention(s): Limited activity within patient's tolerance;Monitored during session;Premedicated before session;Repositioned    Home Living                      Prior Function            PT Goals (current goals can now be found in the care plan section) Progress towards PT goals: Progressing toward goals    Frequency    BID      PT Plan Current plan remains appropriate    Co-evaluation              AM-PAC PT "6 Clicks" Daily Activity  Outcome Measure  Difficulty turning over in bed (including adjusting bedclothes, sheets and blankets)?: Unable Difficulty moving from lying on back to sitting on the  side of the bed? : Unable Difficulty sitting down on and standing up from a chair with arms (e.g., wheelchair, bedside commode, etc,.)?: Unable Help needed moving to and from a bed to chair (including a wheelchair)?: A Little Help needed walking in hospital room?: A Little Help needed climbing 3-5 steps with a railing? : A Lot 6 Click Score: 11    End of Session Equipment Utilized During Treatment: Gait belt Activity Tolerance: Patient tolerated treatment well Patient left: in bed;with call bell/phone within reach;with bed alarm set;with family/visitor present    PT Visit Diagnosis: Pain;Muscle weakness (generalized) (M62.81);Difficulty in walking, not elsewhere classified (R26.2) Pain - Right/Left: Left Pain - part of body: Hip     Time: 1410-1430 PT Time Calculation (min) (ACUTE ONLY): 20 min  Charges:  $Gait Training: 8-22 mins $Therapeutic Exercise: 8-22 mins                    G Codes:        Scot Dock, PTA 04/24/2017, 3:07 PM

## 2017-04-24 NOTE — Progress Notes (Signed)
   Subjective: 2 Days Post-Op Procedure(s) (LRB): TOTAL HIP ARTHROPLASTY ANTERIOR APPROACH (Left) Patient reports pain as 4 on 0-10 scale.   Patient is well, and has had no acute complaints or problems. The patient is having some mild urinary frequency. She has had urinary tract infections over the last year. Denies any CP, SOB, ABD pain. We will continue therapy today.  Plan is to go Home after hospital stay.  Objective: Vital signs in last 24 hours: Temp:  [98.2 F (36.8 C)-98.8 F (37.1 C)] 98.7 F (37.1 C) (01/19 0349) Pulse Rate:  [74-96] 96 (01/19 0349) Resp:  [18] 18 (01/19 0349) BP: (131-154)/(47-65) 154/64 (01/19 0349) SpO2:  [93 %-98 %] 98 % (01/19 0349)  Intake/Output from previous day: 01/18 0701 - 01/19 0700 In: 1308 [P.O.:840; I.V.:468] Out: -  Intake/Output this shift: No intake/output data recorded.  Recent Labs    04/23/17 0315 04/24/17 0352  HGB 10.0* 10.0*   Recent Labs    04/23/17 0315 04/24/17 0352  WBC 10.3 9.1  RBC 3.31* 3.31*  HCT 30.6* 30.3*  PLT 215 201   Recent Labs    04/23/17 0315 04/24/17 0352  NA 138 133*  K 5.3* 4.5  CL 107 103  CO2 22 19*  BUN 30* 28*  CREATININE 1.08* 1.13*  GLUCOSE 135* 119*  CALCIUM 8.3* 8.2*   No results for input(s): LABPT, INR in the last 72 hours.  EXAM General - Patient is Alert, Appropriate and Oriented Extremity - Neurovascular intact Sensation intact distally Intact pulses distally Dorsiflexion/Plantar flexion intact No cellulitis present Compartment soft Dressing - wound vac intact Motor Function - intact, moving foot and toes well on exam. The patient ambulated 40 feet with physical therapy  Past Medical History:  Diagnosis Date  . A-fib (HCC)   . Arthritis   . High cholesterol   . Hypertension   . Renal disorder    ckd  . Sleep apnea     Assessment/Plan:   2 Days Post-Op Procedure(s) (LRB): TOTAL HIP ARTHROPLASTY ANTERIOR APPROACH (Left) Active Problems:   Primary  localized osteoarthritis of left hip   Acute post op blood loss anemia   Estimated body mass index is 39.33 kg/m as calculated from the following:   Height as of this encounter: 5\' 3"  (1.6 m).   Weight as of this encounter: 100.7 kg (222 lb). Advance diet Up with therapy  Needs bowel movement Urinalysis for increased frequency.  Plan on discharge to home with home health PT on Sunday pending progress with PT   DVT Prophylaxis - Foot Pumps and TED hose, Eliquis Weight-Bearing as tolerated to left leg   Dedra Skeensodd Shanekia Latella PA-C [facility] 04/24/2017, 7:58 AM

## 2017-04-24 NOTE — Progress Notes (Signed)
Physical Therapy Treatment Patient Details Name: Stacy Conway MRN: [id number] DOB: 01/02/1935 Today's Date: 04/24/2017    History of Present Illness Pt admitted for L hip THA.     PT Comments    Pt agreeable to PT; reports 8/10 pain with movement/ambulation in left hip. Pt continues to require Mod A for bed mobility with increased time; sequences cues throughout task. STS with Min A and short ambulation with Min guard. Instruction for quad and glut activation and upright posture. Set up for toileting and Min guard for standing personal hygiene. Pt participates in exercises in chair; also encouraged with re instruction on use of incentive spirometer. Continue PT this afternoon for progression of strength,endurance to improve functional mobility.   Follow Up Recommendations  SNF     Equipment Recommendations  Rolling walker with 5" wheels(already received)    Recommendations for Other Services       Precautions / Restrictions Precautions Precautions: Fall;Anterior Hip Restrictions Weight Bearing Restrictions: Yes LLE Weight Bearing: Weight bearing as tolerated    Mobility  Bed Mobility Overal bed mobility: Needs Assistance Bed Mobility: Supine to Sit     Supine to sit: Mod assist     General bed mobility comments: Assist and instruction for sequence; assist to scoot hips to edge of bed, mobilize LEs and elevate trunk  Transfers Overall transfer level: Needs assistance Equipment used: Rolling walker (2 wheeled) Transfers: Sit to/from Stand Sit to Stand: Min assist         General transfer comment: From bed; [facility] and to recliner. Slow to rise and descend. Feels it is improved from last session  Ambulation/Gait Ambulation/Gait assistance: Min guard Ambulation Distance (Feet): 3 Feet(second 6 ft) Assistive device: Rolling walker (2 wheeled) Gait Pattern/deviations: Step-to pattern;Trunk flexed   Gait velocity interpretation: <1.8 ft/sec, indicative of risk  for recurrent falls General Gait Details: Small guarded steps; poor posture   Stairs            Wheelchair Mobility    Modified Rankin (Stroke Patients Only)       Balance Overall balance assessment: Needs assistance Sitting-balance support: Feet supported Sitting balance-Leahy Scale: Good     Standing balance support: Bilateral upper extremity supported Standing balance-Leahy Scale: Fair                              Cognition Arousal/Alertness: Awake/alert Behavior During Therapy: WFL for tasks assessed/performed Overall Cognitive Status: Within Functional Limits for tasks assessed                                        Exercises Total Joint Exercises Ankle Circles/Pumps: AROM;Both;20 reps Quad Sets: Strengthening;Both;20 reps Gluteal Sets: Strengthening;Both;20 reps Heel Slides: AAROM;Left;10 reps Hip ABduction/ADduction: AAROM;Left;10 reps    General Comments        Pertinent Vitals/Pain Pain Assessment: 0-10 Pain Score: 8 (With movement/ambulation; none at rest) Pain Location: L hip  Pain Intervention(s): Limited activity within patient's tolerance;Monitored during session;Premedicated before session;Repositioned    Home Living                      Prior Function            PT Goals (current goals can now be found in the care plan section) Progress towards PT goals: Progressing toward goals    Frequency  BID      PT Plan Current plan remains appropriate    Co-evaluation              AM-PAC PT "6 Clicks" Daily Activity  Outcome Measure  Difficulty turning over in bed (including adjusting bedclothes, sheets and blankets)?: Unable Difficulty moving from lying on back to sitting on the side of the bed? : Unable Difficulty sitting down on and standing up from a chair with arms (e.g., wheelchair, bedside commode, etc,.)?: Unable Help needed moving to and from a bed to chair (including a  wheelchair)?: A Little Help needed walking in hospital room?: A Little Help needed climbing 3-5 steps with a railing? : A Lot 6 Click Score: 11    End of Session Equipment Utilized During Treatment: Gait belt Activity Tolerance: Patient tolerated treatment well Patient left: in chair;with call bell/phone within reach;with chair alarm set   PT Visit Diagnosis: Pain;Muscle weakness (generalized) (M62.81);Difficulty in walking, not elsewhere classified (R26.2) Pain - Right/Left: Left Pain - part of body: Hip     Time: 1610-9604 PT Time Calculation (min) (ACUTE ONLY): 26 min  Charges:  $Gait Training: 8-22 mins $Therapeutic Exercise: 8-22 mins                    G Codes:        Scot Dock, PTA 04/24/2017, 11:49 AM

## 2017-04-25 NOTE — Progress Notes (Signed)
Report called to Belinda at [facility]. EMS Called, Waiting on transport.

## 2017-04-25 NOTE — Discharge Summary (Signed)
Physician Discharge Summary  Subjective: 3 Days Post-Op Procedure(s) (LRB): TOTAL HIP ARTHROPLASTY ANTERIOR APPROACH (Left) Patient reports pain as 4 on 0-10 scale.   Patient seen in rounds with Dr. Rosita KeaMenz. Patient is well, and has had no acute complaints or problems Patient is ready to go to rehabilitation for physical therapy  Physician Discharge Summary  Patient ID: Stacy Conway MRN: [id number] DOB/AGE: 81/05/1934 83 y.o.  Admit date: 04/22/2017 Discharge date: 04/25/2017  Admission Diagnoses:  Discharge Diagnoses:  Active Problems:   Primary localized osteoarthritis of left hip   Discharged Condition: fair  Hospital Course: The patient is postop day 3 from a left anterior total hip replacement. She is doing well since surgery. She is ambulated up to 40 feet yesterday only did 16 feet with physical therapy. Her vitals have remained stable. She is still working on having a bowel movement.  Treatments: surgery:  TOTAL HIP ARTHROPLASTY ANTERIOR APPROACH (Left)  SURGEON: Leitha SchullerMichael J Menz, MD  ASSISTANTS: None  ANESTHESIA:   general  EBL:  Total I/O In: 800 [I.V.:800] Out: 450 [Urine:300; Blood:150]  BLOOD ADMINISTERED:none  DRAINS: (2) Hemovact drain(s) in the Subcutaneous layer with  Suction Open   LOCAL MEDICATIONS USED:  MARCAINE     SPECIMEN:  Source of Specimen:  Left femoral head  DISPOSITION OF SPECIMEN:  PATHOLOGY  COUNTS:  YES  TOURNIQUET:  * No tourniquets in log *  IMPLANTS: Medacta Amis 3 standard STEM with 54 mm Mpact cup DM, lot 54 mm liner and S 28 mm metal head    Discharge Exam: Blood pressure (!) 113/43, pulse 73, temperature 98.9 F (37.2 C), temperature source Oral, resp. rate 18, height 5\' 3"  (1.6 m), weight 100.7 kg (222 lb), SpO2 94 %.   Disposition: 01-Home or Self Care   Allergies as of 04/25/2017      Reactions   Levofloxacin Shortness Of Breath   Aspirin Diarrhea   Nsaids Diarrhea   Scallops [shellfish Allergy]  Nausea And Vomiting   Penicillins Itching, Rash, Other (See Comments)   Has patient had a PCN reaction causing immediate rash, facial/tongue/throat swelling, SOB or lightheadedness with hypotension: Unknown Has patient had a PCN reaction causing severe rash involving mucus membranes or skin necrosis: No Has patient had a PCN reaction that required hospitalization: No Has patient had a PCN reaction occurring within the last 10 years: No If all of the above answers are "NO", then may proceed with Cephalosporin use.   Sulfa Antibiotics Itching, Rash      Medication List    TAKE these medications   amLODipine 10 MG tablet Commonly known as:  NORVASC Take 10 mg by mouth every evening.   atenolol 25 MG tablet Commonly known as:  TENORMIN Take 25 mg by mouth every evening.   CALCIUM-VITAMIN D PO Take 1 tablet by mouth daily.   CRANBERRY PO Take 1 capsule by mouth daily.   DIPHENHYDRAMINE HCL PO Take 25 mg by mouth at bedtime as needed.   diphenhydramine-acetaminophen 25-500 MG Tabs tablet Commonly known as:  TYLENOL PM Take 1-2 tablets by mouth at bedtime as needed (for sleep).   ELIQUIS 5 MG Tabs tablet Generic drug:  apixaban Take 5 mg by mouth 2 (two) times daily.   furosemide 40 MG tablet Commonly known as:  LASIX Take 40 mg by mouth daily as needed for fluid.   lisinopril 20 MG tablet Commonly known as:  PRINIVIL,ZESTRIL Take 20 mg by mouth every evening.   loratadine 10 MG tablet Commonly  known as:  CLARITIN Take 10 mg by mouth at bedtime.   LUTEIN-ZEAXANTHIN PO Take 1 capsule by mouth daily.   MULTIVITAMIN PO Take 1 tablet by mouth daily.   OSTEO BI-FLEX TRIPLE STRENGTH PO Take 1 tablet by mouth 2 (two) times daily.   oxyCODONE 5 MG immediate release tablet Commonly known as:  Oxy IR/ROXICODONE Take 1 tablet (5 mg total) by mouth every 4 (four) hours as needed for moderate pain ((score 4 to 6)).   simvastatin 20 MG tablet Commonly known as:  ZOCOR Take  20 mg by mouth every evening.   TURMERIC PO Take 1 capsule by mouth 2 (two) times daily.            Durable Medical Equipment  (From admission, onward)        Start     Ordered   04/22/17 1346  DME Bedside commode  Once    Question:  Patient needs a bedside commode to treat with the following condition  Answer:  Status post total hip replacement, left   04/22/17 1345   04/22/17 1346  DME 3 n 1  Once     04/22/17 1345   04/22/17 1346  DME Walker rolling  Once    Question:  Patient needs a walker to treat with the following condition  Answer:  Status post total hip replacement, left   04/22/17 1345     Contact information for after-discharge care    Destination    [facility] .   Service:  Skilled Nursing Contact information: [address] [place] [address] [phone]              Signed: Lenard Forth, Osker Ayoub 04/25/2017, 8:18 AM   Objective: Vital signs in last 24 hours: Temp:  [98.7 F (37.1 C)-99.8 F (37.7 C)] 98.9 F (37.2 C) (01/20 0726) Pulse Rate:  [73-92] 73 (01/20 0726) Resp:  [18] 18 (01/20 0726) BP: (113-148)/(43-50) 113/43 (01/20 0726) SpO2:  [90 %-95 %] 94 % (01/20 0726)  Intake/Output from previous day:  Intake/Output Summary (Last 24 hours) at 04/25/2017 0818 Last data filed at 04/24/2017 1900 Gross per 24 hour  Intake 2247.5 ml  Output 300 ml  Net 1947.5 ml    Intake/Output this shift: No intake/output data recorded.  Labs: Recent Labs    04/23/17 0315 04/24/17 0352  HGB 10.0* 10.0*   Recent Labs    04/23/17 0315 04/24/17 0352  WBC 10.3 9.1  RBC 3.31* 3.31*  HCT 30.6* 30.3*  PLT 215 201   Recent Labs    04/23/17 0315 04/24/17 0352  NA 138 133*  K 5.3* 4.5  CL 107 103  CO2 22 19*  BUN 30* 28*  CREATININE 1.08* 1.13*  GLUCOSE 135* 119*  CALCIUM 8.3* 8.2*   No results for input(s): LABPT, INR in the last 72 hours.  EXAM: General - Patient is Alert and Oriented Extremity - Sensation  intact distally Dorsiflexion/Plantar flexion intact Compartment soft Incision - clean, dry, with the wound VAC in place Motor Function -  dorsiflexion and plantarflexion are intact.  Assessment/Plan: 3 Days Post-Op Procedure(s) (LRB): TOTAL HIP ARTHROPLASTY ANTERIOR APPROACH (Left) Procedure(s) (LRB): TOTAL HIP ARTHROPLASTY ANTERIOR APPROACH (Left) Past Medical History:  Diagnosis Date  . A-fib (HCC)   . Arthritis   . High cholesterol   . Hypertension   . Renal disorder    ckd  . Sleep apnea    Active Problems:   Primary localized osteoarthritis of left hip  Estimated body mass index is 39.33 kg/m as calculated from the following:   Height as of this encounter: 5\' 3"  (1.6 m).   Weight as of this encounter: 100.7 kg (222 lb). Up with therapy Discharge to SNF  Bowel movement before discharge Diet - Regular diet Follow up - in 2 weeks Activity - WBAT Disposition - Rehab Condition Upon Discharge - Stable DVT Prophylaxis - TED hose and Eliquis  Dedra Skeens, PA-C Orthopaedic Surgery 04/25/2017, 8:18 AM

## 2017-04-25 NOTE — Progress Notes (Signed)
   Subjective: 3 Days Post-Op Procedure(s) (LRB): TOTAL HIP ARTHROPLASTY ANTERIOR APPROACH (Left) Patient reports pain as 4 on 0-10 scale.   Patient is well, and has had no acute complaints or problems. The patient is having some mild urinary frequency. Denies any CP, SOB, ABD pain. We will continue therapy today.  Plan is to go to rehabilitation after hospital stay.  Objective: Vital signs in last 24 hours: Temp:  [98.7 F (37.1 C)-99.8 F (37.7 C)] 98.9 F (37.2 C) (01/20 0726) Pulse Rate:  [73-92] 73 (01/20 0726) Resp:  [18] 18 (01/20 0726) BP: (113-148)/(43-50) 113/43 (01/20 0726) SpO2:  [90 %-95 %] 94 % (01/20 0726)  Intake/Output from previous day: 01/19 0701 - 01/20 0700 In: 2247.5 [P.O.:640; I.V.:1607.5] Out: 300 [Urine:300] Intake/Output this shift: No intake/output data recorded.  Recent Labs    04/23/17 0315 04/24/17 0352  HGB 10.0* 10.0*   Recent Labs    04/23/17 0315 04/24/17 0352  WBC 10.3 9.1  RBC 3.31* 3.31*  HCT 30.6* 30.3*  PLT 215 201   Recent Labs    04/23/17 0315 04/24/17 0352  NA 138 133*  K 5.3* 4.5  CL 107 103  CO2 22 19*  BUN 30* 28*  CREATININE 1.08* 1.13*  GLUCOSE 135* 119*  CALCIUM 8.3* 8.2*   No results for input(s): LABPT, INR in the last 72 hours.  EXAM General - Patient is Alert, Appropriate and Oriented Extremity - Neurovascular intact Sensation intact distally Intact pulses distally Dorsiflexion/Plantar flexion intact No cellulitis present Compartment soft Dressing - wound vac intact Motor Function - intact, moving foot and toes well on exam. The patient ambulated 16 feet with physical therapy  Past Medical History:  Diagnosis Date  . A-fib (HCC)   . Arthritis   . High cholesterol   . Hypertension   . Renal disorder    ckd  . Sleep apnea     Assessment/Plan:   3 Days Post-Op Procedure(s) (LRB): TOTAL HIP ARTHROPLASTY ANTERIOR APPROACH (Left) Active Problems:   Primary localized osteoarthritis of left  hip   Acute post op blood loss anemia   Estimated body mass index is 39.33 kg/m as calculated from the following:   Height as of this encounter: 5\' 3"  (1.6 m).   Weight as of this encounter: 100.7 kg (222 lb). Advance diet Up with therapy  Needs bowel movement   Plan on discharge to rehabilitation today after PT  DVT Prophylaxis - Foot Pumps and TED hose, Eliquis Weight-Bearing as tolerated to left leg   Dedra Skeensodd Weylyn Ricciuti PA-C [facility] 04/25/2017, 8:13 AM

## 2017-04-25 NOTE — Discharge Instructions (Signed)
ANTERIOR APPROACH TOTAL HIP REPLACEMENT POSTOPERATIVE DIRECTIONS   Hip Rehabilitation, Guidelines Following Surgery  The results of a hip operation are greatly improved after range of motion and muscle strengthening exercises. Follow all safety measures which are given to protect your hip. If any of these exercises cause increased pain or swelling in your joint, decrease the amount until you are comfortable again. Then slowly increase the exercises. Call your caregiver if you have problems or questions.   HOME CARE INSTRUCTIONS  Remove items at home which could result in a fall. This includes throw rugs or furniture in walking pathways.   ICE to the affected hip every three hours for 30 minutes at a time and then as needed for pain and swelling.  Continue to use ice on the hip for pain and swelling from surgery. You may notice swelling that will progress down to the foot and ankle.  This is normal after surgery.  Elevate the leg when you are not up walking on it.    Continue to use the breathing machine which will help keep your temperature down.  It is common for your temperature to cycle up and down following surgery, especially at night when you are not up moving around and exerting yourself.  The breathing machine keeps your lungs expanded and your temperature down.  Do not place pillow under knee, focus on keeping the knee straight while resting  DIET You may resume your previous home diet once your are discharged from the hospital.  DRESSING / WOUND CARE / SHOWERING The wound VAC will remain in place for another week, unless it begins to alarm. Removed the wound VAC if the alarm sounds. Apply a surgical bandage on the wound. Keep the surgical dressing until follow up.  The dressing is water proof, so you can shower without any extra covering.  IF THE DRESSING FALLS OFF or the wound gets wet inside, change the dressing with sterile gauze.  Please use good hand washing techniques before  changing the dressing.  Do not use any lotions or creams on the incision until instructed by your surgeon.   Keep your dressing dry with showering.  You can keep it covered and pat dry. Change the surgical dressing daily and reapply a dry dressing each time.  ACTIVITY Walk with your walker as instructed. Use walker as long as suggested by your caregivers. Avoid periods of inactivity such as sitting longer than an hour when not asleep. This helps prevent blood clots.  You may resume a sexual relationship in one month or when given the OK by your doctor.  You may return to work once you are cleared by your doctor.  Do not drive a car for 6 weeks or until released by you surgeon.  Do not drive while taking narcotics.  WEIGHT BEARING Weight bearing as tolerated with assist device (walker, cane, etc) as directed, use it as long as suggested by your surgeon or therapist, typically at least 4-6 weeks.  POSTOPERATIVE CONSTIPATION PROTOCOL Constipation - defined medically as fewer than three stools per week and severe constipation as less than one stool per week.  One of the most common issues patients have following surgery is constipation.  Even if you have a regular bowel pattern at home, your normal regimen is likely to be disrupted due to multiple reasons following surgery.  Combination of anesthesia, postoperative narcotics, change in appetite and fluid intake all can affect your bowels.  In order to avoid complications following surgery, here  are some recommendations in order to help you during your recovery period.  Colace (docusate) - Pick up an over-the-counter form of Colace or another stool softener and take twice a day as long as you are requiring postoperative pain medications.  Take with a full glass of water daily.  If you experience loose stools or diarrhea, hold the colace until you stool forms back up.  If your symptoms do not get better within 1 week or if they get worse, check with  your doctor.  Dulcolax (bisacodyl) - Pick up over-the-counter and take as directed by the product packaging as needed to assist with the movement of your bowels.  Take with a full glass of water.  Use this product as needed if not relieved by Colace only.   MiraLax (polyethylene glycol) - Pick up over-the-counter to have on hand.  MiraLax is a solution that will increase the amount of water in your bowels to assist with bowel movements.  Take as directed and can mix with a glass of water, juice, soda, coffee, or tea.  Take if you go more than two days without a movement. Do not use MiraLax more than once per day. Call your doctor if you are still constipated or irregular after using this medication for 7 days in a row.  If you continue to have problems with postoperative constipation, please contact the office for further assistance and recommendations.  If you experience "the worst abdominal pain ever" or develop nausea or vomiting, please contact the office immediatly for further recommendations for treatment.  ITCHING  If you experience itching with your medications, try taking only a single pain pill, or even half a pain pill at a time.  You can also use Benadryl over the counter for itching or also to help with sleep.   TED HOSE STOCKINGS Wear the elastic stockings on both legs for three weeks following surgery during the day but you may remove then at night for sleeping.  MEDICATIONS See your medication summary on the After Visit Summary that the nursing staff will review with you prior to discharge.  You may have some home medications which will be placed on hold until you complete the course of blood thinner medication.  It is important for you to complete the blood thinner medication as prescribed by your surgeon.  Continue your approved medications as instructed at time of discharge.  PRECAUTIONS If you experience chest pain or shortness of breath - call 911 immediately for transfer to  the hospital emergency department.  If you develop a fever greater that 101 F, purulent drainage from wound, increased redness or drainage from wound, foul odor from the wound/dressing, or calf pain - CONTACT YOUR SURGEON.                                                   FOLLOW-UP APPOINTMENTS Make sure you keep all of your appointments after your operation with your surgeon and caregivers. You should call the office at the above phone number and make an appointment for approximately two weeks after the date of your surgery or on the date instructed by your surgeon outlined in the "After Visit Summary".  RANGE OF MOTION AND STRENGTHENING EXERCISES  These exercises are designed to help you keep full movement of your hip joint. Follow your caregiver's or physical therapist's  instructions. Perform all exercises about fifteen times, three times per day or as directed. Exercise both hips, even if you have had only one joint replacement. These exercises can be done on a training (exercise) mat, on the floor, on a table or on a bed. Use whatever works the best and is most comfortable for you. Use music or television while you are exercising so that the exercises are a pleasant break in your day. This will make your life better with the exercises acting as a break in routine you can look forward to.  Lying on your back, slowly slide your foot toward your buttocks, raising your knee up off the floor. Then slowly slide your foot back down until your leg is straight again.  Lying on your back spread your legs as far apart as you can without causing discomfort.  Lying on your side, raise your upper leg and foot straight up from the floor as far as is comfortable. Slowly lower the leg and repeat.  Lying on your back, tighten up the muscle in the front of your thigh (quadriceps muscles). You can do this by keeping your leg straight and trying to raise your heel off the floor. This helps strengthen the largest muscle  supporting your knee.  Lying on your back, tighten up the muscles of your buttocks both with the legs straight and with the knee bent at a comfortable angle while keeping your heel on the floor.   IF YOU ARE TRANSFERRED TO A SKILLED REHAB FACILITY If the patient is transferred to a skilled rehab facility following release from the hospital, a list of the current medications will be sent to the facility for the patient to continue.  When discharged from the skilled rehab facility, please have the facility set up the patient's Home Health Physical Therapy prior to being released. Also, the skilled facility will be responsible for providing the patient with their medications at time of release from the facility to include their pain medication, the muscle relaxants, and their blood thinner medication. If the patient is still at the rehab facility at time of the two week follow up appointment, the skilled rehab facility will also need to assist the patient in arranging follow up appointment in our office and any transportation needs.  MAKE SURE YOU:  Understand these instructions.  Get help right away if you are not doing well or get worse.    Pick up stool softner and laxative for home use following surgery while on pain medications. Do not submerge incision under water. Please use good hand washing techniques while changing dressing each day. May shower starting three days after surgery. Please use a clean towel to pat the incision dry following showers. Continue to use ice for pain and swelling after surgery. Do not use any lotions or creams on the incision until instructed by your surgeon.

## 2017-04-25 NOTE — Clinical Social Work Note (Signed)
The patient will discharge today to [facility] via non-emergent EMS. The patient's daughter and facility are aware and in agreement with this plan. CSW has delivered the discharge packet, and the facility has received all documentation. CSW is signing off. Please consult should additional needs arise.  Argentina PonderKaren Martha Mykia Holton, MSW, Theresia MajorsLCSWA [phone]

## 2017-04-26 LAB — SURGICAL PATHOLOGY

## 2019-06-05 IMAGING — XA DG HIP (WITH PELVIS) OPERATIVE*L*
2 series · 2 of 2 positions shown · non-contrast
Comparison: None.

FLUOROSCOPY TIME:  0 minutes 18 seconds; 2 acquired images

CLINICAL DATA: Status post total hip arthroplasty on the left

EXAM:
OPERATIVE LEFT HIP: 1 V
TECHNIQUE: Fluoroscopic spot image(s) were submitted for interpretation
post-operatively.

[Series 5: ortho standard · 1 of 1 slices shown (1 of 2)]
[im 1/1]
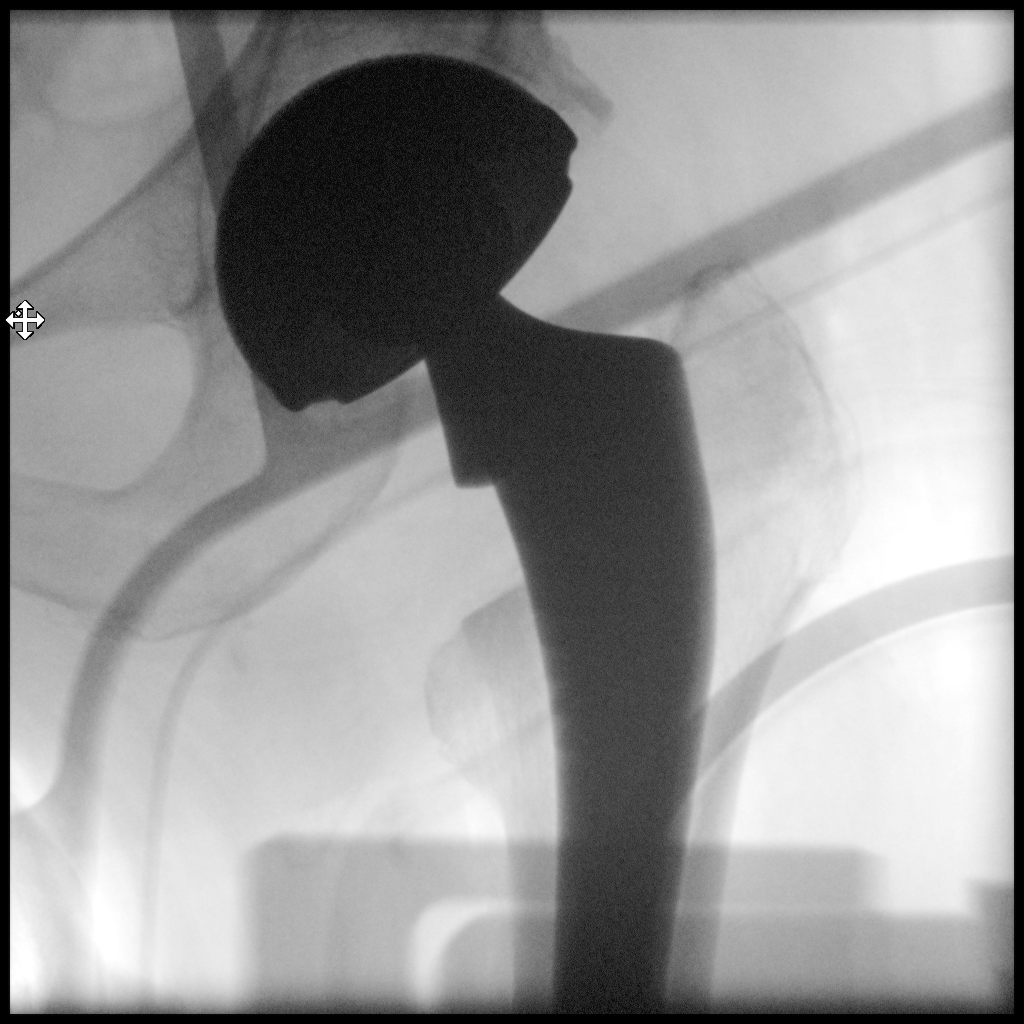

[Series 6: ortho standard · 1 of 1 slices shown (2 of 2)]
[im 1/1]
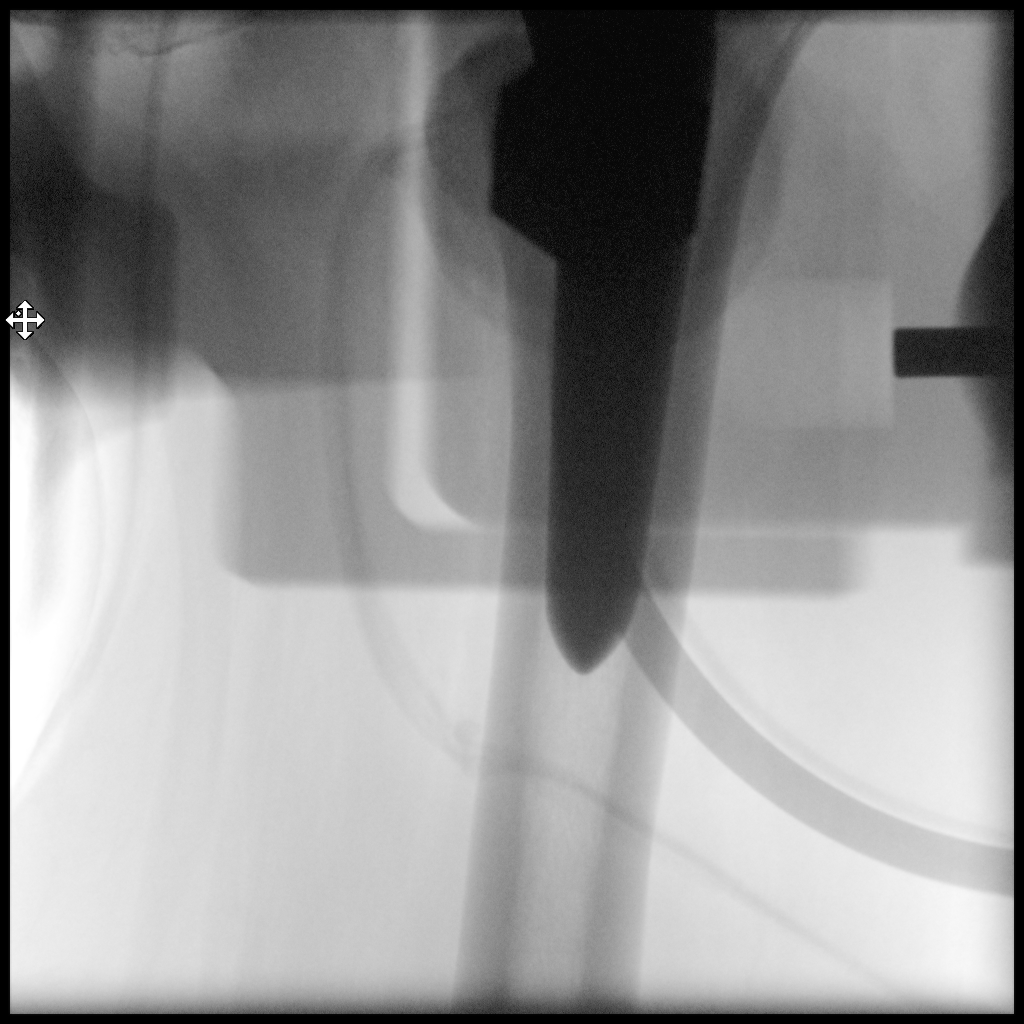

[2 of 2 positions shown; findings below may reference images not displayed]

FINDINGS: Frontal view obtained. There is a total hip replacement on the left
with prosthetic components well-seated. No fracture or dislocation
evident.
IMPRESSION: Total hip replacement on the left with prosthetic components
well-seated on frontal view. No fracture or dislocation evident.

## 2020-12-22 ENCOUNTER — Emergency Department
Admission: EM | Admit: 2020-12-22 | Discharge: 2020-12-22 | Disposition: A | Discharge status: Home / Self Care | Payer: Medicare Other | Attending: Student in an Organized Health Care Education/Training Program | Admitting: Student in an Organized Health Care Education/Training Program

## 2020-12-22 ENCOUNTER — Other Ambulatory Visit: Payer: Self-pay

## 2020-12-22 DIAGNOSIS — Z96642 Presence of left artificial hip joint: Secondary | ICD-10-CM | POA: Insufficient documentation

## 2020-12-22 DIAGNOSIS — Z7901 Long term (current) use of anticoagulants: Secondary | ICD-10-CM | POA: Diagnosis not present

## 2020-12-22 DIAGNOSIS — L03319 Cellulitis of trunk, unspecified: Secondary | ICD-10-CM | POA: Insufficient documentation

## 2020-12-22 DIAGNOSIS — I4891 Unspecified atrial fibrillation: Secondary | ICD-10-CM | POA: Diagnosis not present

## 2020-12-22 DIAGNOSIS — L02219 Cutaneous abscess of trunk, unspecified: Secondary | ICD-10-CM

## 2020-12-22 DIAGNOSIS — I10 Essential (primary) hypertension: Secondary | ICD-10-CM | POA: Diagnosis not present

## 2020-12-22 DIAGNOSIS — Z79899 Other long term (current) drug therapy: Secondary | ICD-10-CM | POA: Insufficient documentation

## 2020-12-22 NOTE — ED Triage Notes (Signed)
Pt was sent by the walk in clinic for a cyst on her back- pt has a golf ball sized cyst on her R upper back that is red- pt states that its been there for 2 weeks

## 2020-12-22 NOTE — Discharge Instructions (Addendum)
Keep the wound clean, dry, and covered. Take the prescribed antibiotic as provided. Follow-up with your provider for interim wound check.

## 2020-12-22 NOTE — ED Provider Notes (Signed)
[facility] Emergency Department Provider Note ____________________________________________  Time seen: 1519  I have reviewed the triage vital signs and the nursing notes.  HISTORY  Chief Complaint  Cyst   HPI Stacy Conway is a 86 y.o. female resents to the ED from a local urgent care, for evaluation of an area to the posterior trunk concerning for abscess.  Patient noted area probably 2 to 3 days earlier.  She noted this morning scant amount of purulent drainage on her shirt.  She denies any related fevers, chills, sweats, chest pain, or shortness of breath.  She was discharged from the urgent care with a prescription for doxycycline, but advised to report to the ED for suspected I&D. Given that the patient is on daily Eliquis, urgent care provider advised that she needed to be seen in the ED for excisional procedure.  Past Medical History:  Diagnosis Date   A-fib [facility])    Arthritis    High cholesterol    Hypertension    Renal disorder    ckd   Sleep apnea     Patient Active Problem List   Diagnosis Date Noted   Primary localized osteoarthritis of left hip 04/22/2017    Past Surgical History:  Procedure Laterality Date   ABDOMINAL HYSTERECTOMY     BOWEL RESECTION     PARATHYROIDECTOMY  2012   SHOULDER SURGERY     TOTAL HIP ARTHROPLASTY Left 04/22/2017   Procedure: TOTAL HIP ARTHROPLASTY ANTERIOR APPROACH;  Surgeon: Kennedy Bucker, MD;  Location: [facility] ORS;  Service: Orthopedics;  Laterality: Left;    Prior to Admission medications   Medication Sig Start Date End Date Taking? Authorizing Provider  amLODipine (NORVASC) 10 MG tablet Take 10 mg by mouth every evening.  07/23/15   [provider]  apixaban (ELIQUIS) 5 MG TABS tablet Take 5 mg by mouth 2 (two) times daily.    [provider]  atenolol (TENORMIN) 25 MG tablet Take 25 mg by mouth every evening.  07/29/16   [provider]  CALCIUM-VITAMIN D PO Take 1 tablet by  mouth daily.    [provider]  CRANBERRY PO Take 1 capsule by mouth daily.    [provider]  DIPHENHYDRAMINE HCL PO Take 25 mg by mouth at bedtime as needed.    [provider]  diphenhydramine-acetaminophen (TYLENOL PM) 25-500 MG TABS tablet Take 1-2 tablets by mouth at bedtime as needed (for sleep).    [provider]  furosemide (LASIX) 40 MG tablet Take 40 mg by mouth daily as needed for fluid.     [provider]  lisinopril (PRINIVIL,ZESTRIL) 20 MG tablet Take 20 mg by mouth every evening.  07/29/16   [provider]  loratadine (CLARITIN) 10 MG tablet Take 10 mg by mouth at bedtime.    [provider]  LUTEIN-ZEAXANTHIN PO Take 1 capsule by mouth daily.    [provider]  Misc Natural Products (OSTEO BI-FLEX TRIPLE STRENGTH PO) Take 1 tablet by mouth 2 (two) times daily.    [provider]  Multiple Vitamins-Minerals (MULTIVITAMIN PO) Take 1 tablet by mouth daily.    [provider]  oxyCODONE (OXY IR/ROXICODONE) 5 MG immediate release tablet Take 1 tablet (5 mg total) by mouth every 4 (four) hours as needed for moderate pain ((score 4 to 6)). 04/24/17   Dedra Skeens, PA-C  simvastatin (ZOCOR) 20 MG tablet Take 20 mg by mouth every evening. 07/29/16   [provider]  TURMERIC  PO Take 1 capsule by mouth 2 (two) times daily.    [provider]    Allergies Levofloxacin, Aspirin, Nsaids, Scallops [shellfish allergy], Penicillins, and Sulfa antibiotics  History reviewed. No pertinent family history.  Social History Social History   Tobacco Use   Smoking status: Former   Smokeless tobacco: Never   Tobacco comments:    50 years ago  Vaping Use   Vaping Use: Never used  Substance Use Topics   Alcohol use: No   Drug use: No    Review of Systems  Constitutional: Negative for fever. Cardiovascular: Negative for chest pain. Respiratory: Negative for shortness of  breath. Gastrointestinal: Negative for abdominal pain, vomiting and diarrhea. Genitourinary: Negative for dysuria. Musculoskeletal: Negative for back pain. Skin: Negative for rash. Cellulitis as above Neurological: Negative for headaches, focal weakness or numbness. ____________________________________________  PHYSICAL EXAM:  VITAL SIGNS: ED Triage Vitals  Enc Vitals Group     BP 12/22/20 1410 (!) 194/59     Pulse Rate 12/22/20 1410 69     Resp 12/22/20 1410 18     Temp 12/22/20 1410 98.7 F (37.1 C)     Temp Source 12/22/20 1410 Oral     SpO2 12/22/20 1410 97 %     Weight 12/22/20 1412 220 lb (99.8 kg)     Height 12/22/20 1412 5\' 1"  (1.549 m)     Head Circumference --      [facility] --      Pain Score 12/22/20 1411 0     Pain Loc --      Pain Edu? --      Excl. in GC? --     Constitutional: Alert and oriented. Well appearing and in no distress. Head: Normocephalic and atraumatic. Eyes: Conjunctivae are normal. Normal extraocular movements Ears: Canals clear on the right and completely obscured by soft cerumen on the left. TM intact on the right. Neck: Supple. Normal ROM Cardiovascular: Normal rate, regular rhythm. Normal distal pulses. Respiratory: Normal respiratory effort.  Musculoskeletal: Nontender with normal range of motion in all extremities.  Neurologic:  Normal gait without ataxia. Normal speech and language. No gross focal neurologic deficits are appreciated. Skin:  Skin is warm, dry and intact. No rash noted.  Patient with a focal area measuring approximately 3 cm in diameter, with localized erythema and some spontaneous purulent drainage noted superiorly.  No significant induration or fluctuance is noted. ____________________________________________    {LABS (pertinent positives/negatives)  ____________________________________________  MEDIA  See associated Media Image for reference  ____________________________________________   RADIOLOGY Official  radiology report(s): No results found. ____________________________________________  PROCEDURES   Procedures ____________________________________________   INITIAL IMPRESSION / ASSESSMENT AND PLAN / ED COURSE  As part of my medical decision making, I reviewed the following data within the electronic MEDICAL RECORD NUMBER Notes from prior ED visits   Geriatric patient ED evaluation of an area to the posterior trunk concerning for local abscess and cellulitis.  Patient is evaluated for complaints in the ED, found to have a spontaneously draining small abscess with surrounding cellulitis.  Patient is amenable at this time to start antibiotics provided to her by the local urgent care provider.  She will continue to keep the wound clean, dry, and covered, and follow with primary provider for interim wound check.  Return precautions have been reviewed with the patient and her adult daughter.  Stacy Conway was evaluated in Emergency Department on 12/22/2020 for the symptoms described in the history of present illness. She  was evaluated in the context of the global COVID-19 pandemic, which necessitated consideration that the patient might be at risk for infection with the SARS-CoV-2 virus that causes COVID-19. Institutional protocols and algorithms that pertain to the evaluation of patients at risk for COVID-19 are in a state of rapid change based on information released by regulatory bodies including the CDC and federal and state organizations. These policies and algorithms were followed during the patient's care in the ED. ____________________________________________  FINAL CLINICAL IMPRESSION(S) / ED DIAGNOSES  Final diagnoses:  Cellulitis and abscess of trunk      Elton Heid, Charlesetta Ivory, PA-C 12/22/20 1644    Willy Eddy, MD 12/22/20 2024

## 2021-02-18 ENCOUNTER — Other Ambulatory Visit: Payer: Self-pay | Admitting: General Surgery

## 2021-02-19 LAB — SURGICAL PATHOLOGY

## 2021-02-25 ENCOUNTER — Other Ambulatory Visit: Payer: Self-pay | Admitting: General Surgery

## 2021-03-03 LAB — SURGICAL PATHOLOGY

## 2022-03-09 ENCOUNTER — Emergency Department
Admission: EM | Admit: 2022-03-09 | Discharge: 2022-03-09 | Disposition: A | Discharge status: Home / Self Care | Payer: Medicare Other | Attending: Emergency Medicine | Admitting: Emergency Medicine

## 2022-03-09 ENCOUNTER — Emergency Department: Payer: Medicare Other

## 2022-03-09 ENCOUNTER — Other Ambulatory Visit: Payer: Self-pay

## 2022-03-09 ENCOUNTER — Encounter: Payer: Self-pay | Admitting: Emergency Medicine

## 2022-03-09 DIAGNOSIS — E875 Hyperkalemia: Secondary | ICD-10-CM

## 2022-03-09 DIAGNOSIS — I129 Hypertensive chronic kidney disease with stage 1 through stage 4 chronic kidney disease, or unspecified chronic kidney disease: Secondary | ICD-10-CM | POA: Diagnosis not present

## 2022-03-09 DIAGNOSIS — N189 Chronic kidney disease, unspecified: Secondary | ICD-10-CM | POA: Insufficient documentation

## 2022-03-09 DIAGNOSIS — R001 Bradycardia, unspecified: Secondary | ICD-10-CM | POA: Diagnosis present

## 2022-03-09 LAB — CBC
HCT: 39.7 % (ref 36.0–46.0)
Hemoglobin: 12.6 g/dL (ref 12.0–15.0)
MCH: 29.6 pg (ref 26.0–34.0)
MCHC: 31.7 g/dL (ref 30.0–36.0)
MCV: 93.4 fL (ref 80.0–100.0)
Platelets: 264 10*3/uL (ref 150–400)
RBC: 4.25 MIL/uL (ref 3.87–5.11)
RDW: 14 % (ref 11.5–15.5)
WBC: 8.9 10*3/uL (ref 4.0–10.5)
nRBC: 0 % (ref 0.0–0.2)

## 2022-03-09 LAB — COMPREHENSIVE METABOLIC PANEL
ALT: 19 U/L (ref 0–44)
AST: 20 U/L (ref 15–41)
Albumin: 4.2 g/dL (ref 3.5–5.0)
Alkaline Phosphatase: 54 U/L (ref 38–126)
Anion gap: 10 (ref 5–15)
BUN: 44 mg/dL — ABNORMAL HIGH (ref 8–23)
CO2: 19 mmol/L — ABNORMAL LOW (ref 22–32)
Calcium: 9.2 mg/dL (ref 8.9–10.3)
Chloride: 105 mmol/L (ref 98–111)
Creatinine, Ser: 1.2 mg/dL — ABNORMAL HIGH (ref 0.44–1.00)
GFR, Estimated: 44 mL/min — ABNORMAL LOW (ref >60)
Glucose, Bld: 114 mg/dL — ABNORMAL HIGH (ref 70–99)
Potassium: 5.6 mmol/L — ABNORMAL HIGH (ref 3.5–5.1)
Sodium: 134 mmol/L — ABNORMAL LOW (ref 135–145)
Total Bilirubin: 0.6 mg/dL (ref 0.3–1.2)
Total Protein: 7.7 g/dL (ref 6.5–8.1)

## 2022-03-09 LAB — TROPONIN I (HIGH SENSITIVITY): Troponin I (High Sensitivity): 8 ng/L (ref <18)

## 2022-03-09 MED ORDER — SODIUM ZIRCONIUM CYCLOSILICATE 10 G PO PACK
10.0000 g | PACK | Freq: Once | ORAL | Status: AC
  Administered 2022-03-09: 10 g via ORAL
  Filled 2022-03-09: qty 1

## 2022-03-09 MED ORDER — LOKELMA 10 G PO PACK: 10.0000 g | PACK | Freq: Three times a day (TID) | ORAL | 0 refills | Status: AC

## 2022-03-09 NOTE — ED Provider Triage Note (Signed)
Emergency Medicine Provider Triage Evaluation Note  Stacy Conway , a 87 y.o. female  was evaluated in triage.  Pt complains of feels like heart is beating "too slow". History of AFib.    Review of Systems  Positive: + dizziness, short of breath which is worse with exertion.  Negative: No fever, chills or uri sx.   Physical Exam  BP (!) 180/56 (BP Location: Left Arm)   Pulse (!) 52   Temp 98 F (36.7 C) (Oral)   Resp 18   SpO2 94%  Gen:   Awake, no distress   Resp:  Normal effort, clear bilat.   MSK:   Moves extremities without difficulty  Other:    Medical Decision Making  Medically screening exam initiated at 9:09 AM.  Appropriate orders placed.  Stacy Conway was informed that the remainder of the evaluation will be completed by another provider, this initial triage assessment does not replace that evaluation, and the importance of remaining in the ED until their evaluation is complete.     Tommi Rumps, PA-C 03/09/22 [phone]

## 2022-03-09 NOTE — ED Provider Notes (Signed)
[facility] Provider Note    Event Date/Time   First MD Initiated Contact with Patient 03/09/22 1245     (approximate)   History   Palpitations   HPI  Stacy Conway is a 87 y.o. female with history of A-fib CKD hypertension sleep apnea who presents with low heart rate.  Patient notes that for some time she has been feeling her heart rate got.  She can hear her heart rate typically and so is quite aware of it.  Typically drops in the evening has been as low as the 20s and 30s.  Has felt worse over the last day or so.  Last night she was not able to sleep because she was scared.  When it dropped she does feel lightheaded and woozy has not syncopized denies chest pain or shortness of breath.  When she wakes up and gets moving heart rate seems to improve.  Currently on my evaluation she is asymptomatic feels like her heart rate has improved.     Past Medical History:  Diagnosis Date   A-fib [facility])    Arthritis    High cholesterol    Hypertension    Renal disorder    ckd   Sleep apnea     Patient Active Problem List   Diagnosis Date Noted   Primary localized osteoarthritis of left hip 04/22/2017     Physical Exam  Triage Vital Signs: ED Triage Vitals  Enc Vitals Group     BP 03/09/22 0908 (!) 180/56     Pulse Rate 03/09/22 0908 (!) 52     Resp 03/09/22 0908 18     Temp 03/09/22 0909 98 F (36.7 C)     Temp Source 03/09/22 0909 Oral     SpO2 03/09/22 0908 94 %     Weight 03/09/22 0909 208 lb (94.3 kg)     Height 03/09/22 0909 5\' 1"  (1.549 m)     Head Circumference --      Peak Flow --      Pain Score 03/09/22 0909 0     Pain Loc --      Pain Edu? --      Excl. in GC? --     Most recent vital signs: Vitals:   03/09/22 0909 03/09/22 1222  BP:  (!) 178/60  Pulse:  (!) 55  Resp:  18  Temp: 98 F (36.7 C) 98 F (36.7 C)  SpO2:  95%     General: Awake, no distress.  CV:  Good peripheral perfusion.  Bradycardic Resp:  Normal  effort.  Abd:  No distention.  Neuro:             Awake, Alert, Oriented x 3  Other:  No peripheral edema   ED Results / Procedures / Treatments  Labs (all labs ordered are listed, but only abnormal results are displayed) Labs Reviewed  COMPREHENSIVE METABOLIC PANEL - Abnormal; Notable for the following components:      Result Value   Sodium 134 (*)    Potassium 5.6 (*)    CO2 19 (*)    Glucose, Bld 114 (*)    BUN 44 (*)    Creatinine, Ser 1.20 (*)    GFR, Estimated 44 (*)    All other components within normal limits  CBC  TROPONIN I (HIGH SENSITIVITY)     EKG  Reviewed interpreted myself shows sinus bradycardia with first-degree AV block, increased T wave amplitude throughout   RADIOLOGY I  reviewed and interpreted the CXR which does not show any acute cardiopulmonary process    PROCEDURES:  Critical Care performed: No  Procedures    MEDICATIONS ORDERED IN ED: Medications  sodium zirconium cyclosilicate (LOKELMA) packet 10 g (has no administration in time range)     IMPRESSION / MDM / ASSESSMENT AND PLAN / ED COURSE  I reviewed the triage vital signs and the nursing notes.                              Patient's presentation is most consistent with acute presentation with potential threat to life or bodily function.  Differential diagnosis includes, but is not limited to, heart block, sinus bradycardia, medication side effect, sick sinus syndrome, electrolyte abnormality  The patient is an 87 year old female with history of underlying A-fib on Eliquis and atenolol who presents because of low heart rate.  Patient hears her heart rate so is very aware of it.  Tells me that she counted and has been dropping to the 20s and 30s at night.  Feels improved when she wakes up.  Was not able to sleep last night because she was worried.  Does feel symptomatic with lightheadedness during this episodes.  EKG shows sinus bradycardia first-degree AV block.  Labs are notable  for hypokalemia with potassium 5.6 renal function minimally changed.  Rate is in the 50s on the monitor and currently she is asymptomatic she is hypertensive.  Patient is quite anxious about her symptoms.  Given the sinus bradycardia with rates in the 50s stable blood pressure minimal symptoms now I think that she can safely be discharged.  She is on atenolol which I think she should temporarily hold.  For the hyperkalemia we will treat with [facility] and we discussed a low potassium diet.  I advised that she have her blood work checked in about 3-5 days to ensure not rising.  Also advised close follow-up with cardiology she may need to have a Holter monitor placed to see if she is having intermittent AV blocks versus just sinus bradycardia.  If heart rate is actually dropped into the 20s and 30s despite atenolol being removed may need to have pacemaker placed at some point.       FINAL CLINICAL IMPRESSION(S) / ED DIAGNOSES   Final diagnoses:  Bradycardia  Hyperkalemia     Rx / DC Orders   ED Discharge Orders          Ordered    sodium zirconium cyclosilicate (LOKELMA) 10 g PACK packet  3 times daily        03/09/22 1355    Ambulatory referral to Cardiology       Comments: If you have not heard from the Cardiology office within the next 72 hours please call [phone].   03/09/22 1356    Ambulatory referral to Cardiology       Comments: If you have not heard from the Cardiology office within the next 72 hours please call [phone].   03/09/22 1357             Note:  This document was prepared using Dragon voice recognition software and may include unintentional dictation errors.   Georga Hacking, MD 03/09/22 1357

## 2022-03-09 NOTE — Discharge Instructions (Addendum)
Please hold your atenolol until you follow-up with your cardiologist.  Please maintain a low potassium diet.  Please take the [facility] 3 times a day for the next 48 hours.  Please have your potassium checked within the next 3 to 5 days to ensure that it is not continue to rise.  Please call Dr. Briscoe Burns office tomorrow to schedule an appointment.

## 2022-03-09 NOTE — ED Notes (Signed)
See triage note  Presents with some abnormal heart rate  States hx of afib  but notices that her heart rate slows in the morning  Denies any pain   states she did hold her metoprolol last pm

## 2022-03-09 NOTE — ED Notes (Addendum)
Unable to draw blood, phelbotomy called.

## 2022-03-09 NOTE — ED Triage Notes (Addendum)
Pt in with co "slow heart beat" states hx of afib. States symptoms for 2-3 weeks but worsened last night. States does have dizziness and shoboe. Pt currently on clindamycin for sinus/dental abscess.

## 2023-09-27 ENCOUNTER — Other Ambulatory Visit: Payer: Self-pay

## 2023-09-27 ENCOUNTER — Inpatient Hospital Stay
Admission: EM | Admit: 2023-09-27 | Discharge: 2023-10-03 | DRG: 377 | Disposition: A | Discharge status: Home Health | Attending: Family Medicine | Admitting: Family Medicine

## 2023-09-27 ENCOUNTER — Inpatient Hospital Stay

## 2023-09-27 DIAGNOSIS — N179 Acute kidney failure, unspecified: Secondary | ICD-10-CM | POA: Diagnosis present

## 2023-09-27 DIAGNOSIS — Z6841 Body Mass Index (BMI) 40.0 and over, adult: Secondary | ICD-10-CM | POA: Diagnosis not present

## 2023-09-27 DIAGNOSIS — K5731 Diverticulosis of large intestine without perforation or abscess with bleeding: Principal | ICD-10-CM | POA: Diagnosis present

## 2023-09-27 DIAGNOSIS — E860 Dehydration: Secondary | ICD-10-CM | POA: Diagnosis present

## 2023-09-27 DIAGNOSIS — G4733 Obstructive sleep apnea (adult) (pediatric): Secondary | ICD-10-CM | POA: Diagnosis present

## 2023-09-27 DIAGNOSIS — I4891 Unspecified atrial fibrillation: Secondary | ICD-10-CM | POA: Diagnosis present

## 2023-09-27 DIAGNOSIS — K3189 Other diseases of stomach and duodenum: Secondary | ICD-10-CM | POA: Diagnosis present

## 2023-09-27 DIAGNOSIS — R001 Bradycardia, unspecified: Secondary | ICD-10-CM | POA: Diagnosis present

## 2023-09-27 DIAGNOSIS — K920 Hematemesis: Secondary | ICD-10-CM | POA: Diagnosis not present

## 2023-09-27 DIAGNOSIS — Z882 Allergy status to sulfonamides status: Secondary | ICD-10-CM

## 2023-09-27 DIAGNOSIS — Z66 Do not resuscitate: Secondary | ICD-10-CM | POA: Diagnosis present

## 2023-09-27 DIAGNOSIS — K449 Diaphragmatic hernia without obstruction or gangrene: Secondary | ICD-10-CM | POA: Diagnosis present

## 2023-09-27 DIAGNOSIS — K921 Melena: Secondary | ICD-10-CM | POA: Diagnosis not present

## 2023-09-27 DIAGNOSIS — Z87891 Personal history of nicotine dependence: Secondary | ICD-10-CM

## 2023-09-27 DIAGNOSIS — G4730 Sleep apnea, unspecified: Secondary | ICD-10-CM | POA: Diagnosis present

## 2023-09-27 DIAGNOSIS — I159 Secondary hypertension, unspecified: Secondary | ICD-10-CM | POA: Diagnosis not present

## 2023-09-27 DIAGNOSIS — I5032 Chronic diastolic (congestive) heart failure: Secondary | ICD-10-CM | POA: Diagnosis present

## 2023-09-27 DIAGNOSIS — N1832 Chronic kidney disease, stage 3b: Secondary | ICD-10-CM | POA: Diagnosis present

## 2023-09-27 DIAGNOSIS — I4811 Longstanding persistent atrial fibrillation: Secondary | ICD-10-CM | POA: Diagnosis not present

## 2023-09-27 DIAGNOSIS — I480 Paroxysmal atrial fibrillation: Secondary | ICD-10-CM | POA: Diagnosis present

## 2023-09-27 DIAGNOSIS — Z79899 Other long term (current) drug therapy: Secondary | ICD-10-CM

## 2023-09-27 DIAGNOSIS — J9601 Acute respiratory failure with hypoxia: Secondary | ICD-10-CM | POA: Diagnosis not present

## 2023-09-27 DIAGNOSIS — E66813 Obesity, class 3: Secondary | ICD-10-CM | POA: Diagnosis present

## 2023-09-27 DIAGNOSIS — K922 Gastrointestinal hemorrhage, unspecified: Principal | ICD-10-CM | POA: Diagnosis present

## 2023-09-27 DIAGNOSIS — Z90710 Acquired absence of both cervix and uterus: Secondary | ICD-10-CM

## 2023-09-27 DIAGNOSIS — K641 Second degree hemorrhoids: Secondary | ICD-10-CM | POA: Diagnosis present

## 2023-09-27 DIAGNOSIS — K580 Irritable bowel syndrome with diarrhea: Secondary | ICD-10-CM | POA: Diagnosis present

## 2023-09-27 DIAGNOSIS — E7800 Pure hypercholesterolemia, unspecified: Secondary | ICD-10-CM | POA: Diagnosis present

## 2023-09-27 DIAGNOSIS — Z96642 Presence of left artificial hip joint: Secondary | ICD-10-CM | POA: Diagnosis present

## 2023-09-27 DIAGNOSIS — D62 Acute posthemorrhagic anemia: Secondary | ICD-10-CM | POA: Diagnosis present

## 2023-09-27 DIAGNOSIS — Z880 Allergy status to penicillin: Secondary | ICD-10-CM

## 2023-09-27 DIAGNOSIS — Z91013 Allergy to seafood: Secondary | ICD-10-CM

## 2023-09-27 DIAGNOSIS — I130 Hypertensive heart and chronic kidney disease with heart failure and stage 1 through stage 4 chronic kidney disease, or unspecified chronic kidney disease: Secondary | ICD-10-CM | POA: Diagnosis present

## 2023-09-27 DIAGNOSIS — Z7901 Long term (current) use of anticoagulants: Secondary | ICD-10-CM

## 2023-09-27 DIAGNOSIS — K319 Disease of stomach and duodenum, unspecified: Secondary | ICD-10-CM | POA: Diagnosis not present

## 2023-09-27 DIAGNOSIS — G4734 Idiopathic sleep related nonobstructive alveolar hypoventilation: Secondary | ICD-10-CM | POA: Diagnosis not present

## 2023-09-27 DIAGNOSIS — I10 Essential (primary) hypertension: Secondary | ICD-10-CM | POA: Diagnosis present

## 2023-09-27 DIAGNOSIS — T45515A Adverse effect of anticoagulants, initial encounter: Secondary | ICD-10-CM | POA: Diagnosis present

## 2023-09-27 DIAGNOSIS — D649 Anemia, unspecified: Secondary | ICD-10-CM

## 2023-09-27 DIAGNOSIS — K5730 Diverticulosis of large intestine without perforation or abscess without bleeding: Secondary | ICD-10-CM | POA: Diagnosis not present

## 2023-09-27 HISTORY — DX: Chronic kidney disease, stage 3b: N18.32

## 2023-09-27 LAB — COMPREHENSIVE METABOLIC PANEL WITH GFR
ALT: 19 U/L (ref 0–44)
AST: 23 U/L (ref 15–41)
Albumin: 4.3 g/dL (ref 3.5–5.0)
Alkaline Phosphatase: 72 U/L (ref 38–126)
Anion gap: 9 (ref 5–15)
BUN: 64 mg/dL — ABNORMAL HIGH (ref 8–23)
CO2: 20 mmol/L — ABNORMAL LOW (ref 22–32)
Calcium: 9 mg/dL (ref 8.9–10.3)
Chloride: 108 mmol/L (ref 98–111)
Creatinine, Ser: 1.75 mg/dL — ABNORMAL HIGH (ref 0.44–1.00)
GFR, Estimated: 28 mL/min — ABNORMAL LOW (ref >60)
Glucose, Bld: 161 mg/dL — ABNORMAL HIGH (ref 70–99)
Potassium: 4.6 mmol/L (ref 3.5–5.1)
Sodium: 137 mmol/L (ref 135–145)
Total Bilirubin: 0.7 mg/dL (ref 0.0–1.2)
Total Protein: 7.5 g/dL (ref 6.5–8.1)

## 2023-09-27 LAB — GASTROINTESTINAL PANEL BY PCR, STOOL (REPLACES STOOL CULTURE)

## 2023-09-27 LAB — CBC
HCT: 38.3 % (ref 36.0–46.0)
HCT: 31.4 % — ABNORMAL LOW (ref 36.0–46.0)
HCT: 32.7 % — ABNORMAL LOW (ref 36.0–46.0)
HCT: 27.8 % — ABNORMAL LOW (ref 36.0–46.0)
Hemoglobin: 11.9 g/dL — ABNORMAL LOW (ref 12.0–15.0)
Hemoglobin: 9.8 g/dL — ABNORMAL LOW (ref 12.0–15.0)
Hemoglobin: 10.2 g/dL — ABNORMAL LOW (ref 12.0–15.0)
Hemoglobin: 9.3 g/dL — ABNORMAL LOW (ref 12.0–15.0)
MCH: 28.9 pg (ref 26.0–34.0)
MCH: 28.4 pg (ref 26.0–34.0)
MCH: 28.7 pg (ref 26.0–34.0)
MCH: 29.7 pg (ref 26.0–34.0)
MCHC: 31.1 g/dL (ref 30.0–36.0)
MCHC: 31.2 g/dL (ref 30.0–36.0)
MCHC: 31.2 g/dL (ref 30.0–36.0)
MCHC: 33.5 g/dL (ref 30.0–36.0)
MCV: 93 fL (ref 80.0–100.0)
MCV: 91 fL (ref 80.0–100.0)
MCV: 92.1 fL (ref 80.0–100.0)
MCV: 88.8 fL (ref 80.0–100.0)
Platelets: 270 10*3/uL (ref 150–400)
Platelets: 239 10*3/uL (ref 150–400)
Platelets: 247 10*3/uL (ref 150–400)
Platelets: 219 10*3/uL (ref 150–400)
RBC: 4.12 MIL/uL (ref 3.87–5.11)
RBC: 3.45 MIL/uL — ABNORMAL LOW (ref 3.87–5.11)
RBC: 3.55 MIL/uL — ABNORMAL LOW (ref 3.87–5.11)
RBC: 3.13 MIL/uL — ABNORMAL LOW (ref 3.87–5.11)
RDW: 16.9 % — ABNORMAL HIGH (ref 11.5–15.5)
RDW: 16.8 % — ABNORMAL HIGH (ref 11.5–15.5)
RDW: 16.9 % — ABNORMAL HIGH (ref 11.5–15.5)
RDW: 17.2 % — ABNORMAL HIGH (ref 11.5–15.5)
WBC: 11 10*3/uL — ABNORMAL HIGH (ref 4.0–10.5)
WBC: 16.9 10*3/uL — ABNORMAL HIGH (ref 4.0–10.5)
WBC: 25.5 10*3/uL — ABNORMAL HIGH (ref 4.0–10.5)
WBC: 19.8 10*3/uL — ABNORMAL HIGH (ref 4.0–10.5)
nRBC: 0 % (ref 0.0–0.2)
nRBC: 0 % (ref 0.0–0.2)
nRBC: 0 % (ref 0.0–0.2)
nRBC: 0 % (ref 0.0–0.2)

## 2023-09-27 LAB — TYPE AND SCREEN
ABO/RH(D): A POS
Antibody Screen: NEGATIVE

## 2023-09-27 LAB — HEMOGLOBIN AND HEMATOCRIT, BLOOD
HCT: 31.6 % — ABNORMAL LOW (ref 36.0–46.0)
Hemoglobin: 9.9 g/dL — ABNORMAL LOW (ref 12.0–15.0)

## 2023-09-27 LAB — PROTIME-INR
INR: 1.4 — ABNORMAL HIGH (ref 0.8–1.2)
Prothrombin Time: 17.4 s — ABNORMAL HIGH (ref 11.4–15.2)

## 2023-09-27 MED ORDER — LACTATED RINGERS IV BOLUS
1000.0000 mL | Freq: Once | INTRAVENOUS | Status: AC
  Administered 2023-09-27: 1000 mL via INTRAVENOUS

## 2023-09-27 MED ORDER — ACETAMINOPHEN 650 MG RE SUPP
650.0000 mg | Freq: Four times a day (QID) | RECTAL | Status: DC | PRN
Start: 2023-09-27 — End: 2023-10-03

## 2023-09-27 MED ORDER — ONDANSETRON HCL 4 MG PO TABS: 4.0000 mg | ORAL_TABLET | Freq: Four times a day (QID) | ORAL | Status: DC | PRN

## 2023-09-27 MED ORDER — SODIUM CHLORIDE 0.9% FLUSH
3.0000 mL | Freq: Two times a day (BID) | INTRAVENOUS | Status: DC
  Administered 2023-09-27 – 2023-10-03 (×10): 3 mL via INTRAVENOUS

## 2023-09-27 MED ORDER — AMLODIPINE BESYLATE 10 MG PO TABS
10.0000 mg | ORAL_TABLET | Freq: Every evening | ORAL | Status: DC
  Administered 2023-09-27 – 2023-09-28 (×2): 10 mg via ORAL
  Filled 2023-09-27 (×3): qty 1

## 2023-09-27 MED ORDER — SIMVASTATIN 20 MG PO TABS
20.0000 mg | ORAL_TABLET | Freq: Every evening | ORAL | Status: DC
Start: 2023-09-27 — End: 2023-10-03
  Administered 2023-09-27 – 2023-10-02 (×5): 20 mg via ORAL
  Filled 2023-09-27 (×6): qty 1

## 2023-09-27 MED ORDER — ONDANSETRON HCL 4 MG/2ML IJ SOLN: 4.0000 mg | Freq: Four times a day (QID) | INTRAMUSCULAR | Status: DC | PRN

## 2023-09-27 MED ORDER — LACTATED RINGERS IV SOLN: INTRAVENOUS | Status: DC

## 2023-09-27 MED ORDER — MORPHINE SULFATE (PF) 2 MG/ML IV SOLN
2.0000 mg | INTRAVENOUS | Status: DC | PRN
  Administered 2023-09-29: 2 mg via INTRAVENOUS
  Filled 2023-09-27: qty 1

## 2023-09-27 MED ORDER — HYDRALAZINE HCL 20 MG/ML IJ SOLN
5.0000 mg | INTRAMUSCULAR | Status: DC | PRN
  Administered 2023-09-29: 5 mg via INTRAVENOUS
  Filled 2023-09-27: qty 1

## 2023-09-27 MED ORDER — ACETAMINOPHEN 500 MG PO TABS
1000.0000 mg | ORAL_TABLET | Freq: Once | ORAL | Status: AC
  Administered 2023-09-27: 1000 mg via ORAL
  Filled 2023-09-27: qty 2

## 2023-09-27 MED ORDER — ACETAMINOPHEN 325 MG PO TABS
650.0000 mg | ORAL_TABLET | Freq: Four times a day (QID) | ORAL | Status: DC | PRN
Start: 2023-09-27 — End: 2023-10-03
  Administered 2023-09-27 – 2023-10-02 (×3): 650 mg via ORAL
  Filled 2023-09-27 (×4): qty 2

## 2023-09-27 MED ORDER — PANTOPRAZOLE SODIUM 40 MG IV SOLR
40.0000 mg | Freq: Once | INTRAVENOUS | Status: AC
  Administered 2023-09-27: 40 mg via INTRAVENOUS
  Filled 2023-09-27: qty 10

## 2023-09-27 NOTE — Progress Notes (Signed)
 Pt has arrived to floor.  Pt's daughter is with her. Per Jinny, MD, Pt is to be on Clear Liquid Diet.  Pt is NPO as of 09/28/23 5 am. Pt is A&Ox4. Pt is a little HOH. Pt has not had a BM. Continues to have bloody output rectally. Pt is on LR 100 ml/hr continuous. Pt is on Enteric Precautions pending GI PCR test results.  Pt is stable at this time.

## 2023-09-27 NOTE — ED Notes (Signed)
 Nuclear Med to send pt to inpt room when procedure complete.

## 2023-09-27 NOTE — ED Triage Notes (Signed)
 Pt presented to ED BIBA from home with c/o bright red blood filling toilet starting at 0400. States that she started shaking and feeling nauseous and that is why she called EMS. Pt received 4mg  zofran  and 100ml NS via EMS. Pt also endorses lightheadedness. States noticed dark stools yesterday evening. On Eliquis  for afib.

## 2023-09-27 NOTE — ED Provider Notes (Signed)
 [facility] Provider Note   Event Date/Time   First MD Initiated Contact with Patient 09/27/23 0700     (approximate) History  Rectal Bleeding  HPI Stacy Conway is a 89 y.o. female with a history of atrial fibrillation on Eliquis  who presents complaining of 4 days of dark stools followed by bright red blood per rectum this morning.  Patient states last dose of Eliquis  last night at 11:00.  Patient denies any orthostatic lightheadedness.  Patient denies any history of GI bleeding in the past.  Patient denies any abdominal or rectal pain ROS: Patient currently denies any vision changes, tinnitus, difficulty speaking, facial droop, sore throat, chest pain, shortness of breath, nausea/vomiting/diarrhea, dysuria, or weakness/numbness/paresthesias in any extremity   Physical Exam  Triage Vital Signs: ED Triage Vitals  Encounter Vitals Group     BP 09/27/23 0648 (!) 137/103     Girls Systolic BP Percentile --      Girls Diastolic BP Percentile --      Boys Systolic BP Percentile --      Boys Diastolic BP Percentile --      Pulse Rate 09/27/23 0648 86     Resp 09/27/23 0648 15     Temp 09/27/23 0648 (!) 100.7 F (38.2 C)     Temp Source 09/27/23 0648 Oral     SpO2 09/27/23 0648 100 %     Weight 09/27/23 0650 217 lb 3.2 oz (98.5 kg)     Height 09/27/23 0650 5' 1 (1.549 m)     Head Circumference --      Peak Flow --      Pain Score 09/27/23 0649 6     Pain Loc --      Pain Education --      Exclude from Growth Chart --    Most recent vital signs: Vitals:   09/27/23 0900 09/27/23 0930  BP: (!) 100/54 (!) 107/40  Pulse: 71 76  Resp: 18 (!) 21  Temp:    SpO2: 94% 96%   General: Awake, oriented x4. CV:  Good peripheral perfusion. Resp:  Normal effort. Abd:  No distention. Other:  Elderly obese Caucasian female resting comfortably in no acute distress ED Results / Procedures / Treatments  Labs (all labs ordered are listed, but only abnormal results  are displayed) Labs Reviewed  COMPREHENSIVE METABOLIC PANEL WITH GFR - Abnormal; Notable for the following components:      Result Value   CO2 20 (*)    Glucose, Bld 161 (*)    BUN 64 (*)    Creatinine, Ser 1.75 (*)    GFR, Estimated 28 (*)    All other components within normal limits  CBC - Abnormal; Notable for the following components:   WBC 11.0 (*)    Hemoglobin 11.9 (*)    RDW 16.9 (*)    All other components within normal limits  PROTIME-INR - Abnormal; Notable for the following components:   Prothrombin Time 17.4 (*)    INR 1.4 (*)    All other components within normal limits  HEMOGLOBIN AND HEMATOCRIT, BLOOD - Abnormal; Notable for the following components:   Hemoglobin 9.9 (*)    HCT 31.6 (*)    All other components within normal limits  GASTROINTESTINAL PANEL BY PCR, STOOL (REPLACES STOOL CULTURE)  CBC  POC OCCULT BLOOD, ED  TYPE AND SCREEN  RADIOLOGY ED MD interpretation: Pending - All radiology independently interpreted and agree with radiology assessment Official radiology report(s): No  results found. PROCEDURES: Critical Care performed: No Procedures MEDICATIONS ORDERED IN ED: Medications  lactated ringers  bolus 1,000 mL (0 mLs Intravenous Stopped 09/27/23 0946)  acetaminophen  (TYLENOL ) tablet 1,000 mg (1,000 mg Oral Given 09/27/23 0756)  pantoprazole (PROTONIX) injection 40 mg (40 mg Intravenous Given 09/27/23 1019)   IMPRESSION / MDM / ASSESSMENT AND PLAN / ED COURSE  I reviewed the triage vital signs and the nursing notes.                             The patient is on the cardiac monitor to evaluate for evidence of arrhythmia and/or significant heart rate changes. Patient's presentation is most consistent with acute presentation with potential threat to life or bodily function. + abdominal pain + mixed bright red and black stool per rectum Given history and exam patient's presentation most consistent with upper GI bleed possibly secondary to peptic ulcer  disease or variceal bleeding. I have low suspicion for aortoenteric fistula, ENT bleeding mimic, Boerhaave's, Pulmonary bleeding mimic.  Workup: CBC, BMP, LFTs, Lipase, PT/INR, Type and Screen  Interventions: Analgesia and antiemetic medications PRN Protonix 40mg  IVP PRBC transfusion PRN  Findings: Hb: 11.9--9.9 I spoke to Dr. Jinny in gastroenterology who recommends tagged red blood cell scan and admission to medicine Disposition: Admit for close monitoring.   FINAL CLINICAL IMPRESSION(S) / ED DIAGNOSES   Final diagnoses:  Acute GI bleeding  Anemia, unspecified type   Rx / DC Orders   ED Discharge Orders     None      Note:  This document was prepared using Dragon voice recognition software and may include unintentional dictation errors.   Francenia Chimenti K, MD 09/27/23 [phone]

## 2023-09-27 NOTE — ED Notes (Signed)
 Pt taken to Nuc Med for GI study

## 2023-09-27 NOTE — Plan of Care (Signed)

## 2023-09-27 NOTE — H&P (Signed)
 History and Physical    Patient: Stacy Conway FMW:[id number] DOB: Jul 05, 1934 DOA: 09/27/2023 DOS: the patient was seen and examined on 09/27/2023 PCP: Rudolpho Norleen BIRCH, MD  Patient coming from: Home - lives with husband; NOK: Daughter, [phone]   Chief Complaint: Rectal bleeding  HPI: Stacy Conway is a 89 y.o. female with medical history significant of afib on Eliquis , HLD, HTN, [name], morbid obesity, and stage 3b CKD who presented on 6/23 with rectal bleeding. She reports that she had bloody diarrhea starting about 0400 today.  It was initially dark brown, 15-20 minutes later it was bright red.  She has had 3 total episodes today but hardly anything the last night.  Diarrhea itself has been there forever, has IBS-D.  She had remote colon surgery years ago, they didn't find anything.  No recent change to stools until today.  She has in the past had some bright red blood and itching with wiping but not like today.  She is having gas, has not further had the urge to go since she got here.  This AM, she was light-headed - this has resolved.  She was nauseated with dry heaves.    ER Course:   GI bleeding.  Spoken to Dr. Jinny - suggests tagged RBC scan (no CTA due to AKI on CKD).  On Eliquis  for afib.  Hgb 11.9 -> 9.9.  Temp to 100.7.  Stool sample ordered for GI pathogens.     Review of Systems: As mentioned in the history of present illness. All other systems reviewed and are negative. Past Medical History:  Diagnosis Date   A-fib [facility])    Arthritis    DNR (do not resuscitate) 09/27/2023   High cholesterol    Hypertension    Obesity, Class III, BMI 40-49.9 (morbid obesity) 09/27/2023   Sleep apnea    Stage 3b chronic kidney disease (HCC)    ckd   Past Surgical History:  Procedure Laterality Date   ABDOMINAL HYSTERECTOMY     BOWEL RESECTION     PARATHYROIDECTOMY  2012   SHOULDER SURGERY     TOTAL HIP ARTHROPLASTY Left 04/22/2017   Procedure: TOTAL HIP ARTHROPLASTY  ANTERIOR APPROACH;  Surgeon: Kathlynn Sharper, MD;  Location: [facility] ORS;  Service: Orthopedics;  Laterality: Left;   Social History:  reports that she has quit smoking. She has never used smokeless tobacco. She reports that she does not drink alcohol and does not use drugs.  Allergies  Allergen Reactions   Levofloxacin Shortness Of Breath   Aspirin  Diarrhea and Other (See Comments)    GI upset   Nitrofurantoin Nausea Only   Nsaids Diarrhea   Scallops [Shellfish Allergy] Nausea And Vomiting   Penicillins Itching, Rash and Other (See Comments)    Has patient had a PCN reaction causing immediate rash, facial/tongue/throat swelling, SOB or lightheadedness with hypotension: Unknown Has patient had a PCN reaction causing severe rash involving mucus membranes or skin necrosis: No Has patient had a PCN reaction that required hospitalization: No Has patient had a PCN reaction occurring within the last 10 years: No If all of the above answers are NO, then may proceed with Cephalosporin use.    Sulfa Antibiotics Itching and Rash    History reviewed. No pertinent family history.  Prior to Admission medications   Medication Sig Start Date End Date Taking? Authorizing Provider  amLODipine  (NORVASC ) 10 MG tablet Take 10 mg by mouth every evening.  07/23/15   [provider]  apixaban  (ELIQUIS ) 5 MG  TABS tablet Take 5 mg by mouth 2 (two) times daily.    [provider]  atenolol  (TENORMIN ) 25 MG tablet Take 25 mg by mouth every evening.  07/29/16   [provider]  CALCIUM-VITAMIN D PO Take 1 tablet by mouth daily.    [provider]  CRANBERRY PO Take 1 capsule by mouth daily.    [provider]  DIPHENHYDRAMINE  HCL PO Take 25 mg by mouth at bedtime as needed.    [provider]  diphenhydramine -acetaminophen  (TYLENOL  PM) 25-500 MG TABS tablet Take 1-2 tablets by mouth at bedtime as needed (for sleep).    [provider]  furosemide  (LASIX )  40 MG tablet Take 40 mg by mouth daily as needed for fluid.     [provider]  lisinopril  (PRINIVIL ,ZESTRIL ) 20 MG tablet Take 20 mg by mouth every evening.  07/29/16   [provider]  loratadine  (CLARITIN ) 10 MG tablet Take 10 mg by mouth at bedtime.    [provider]  LUTEIN-ZEAXANTHIN PO Take 1 capsule by mouth daily.    [provider]  Misc Natural Products (OSTEO BI-FLEX TRIPLE STRENGTH PO) Take 1 tablet by mouth 2 (two) times daily.    [provider]  Multiple Vitamins-Minerals (MULTIVITAMIN PO) Take 1 tablet by mouth daily.    [provider]  oxyCODONE  (OXY IR/ROXICODONE ) 5 MG immediate release tablet Take 1 tablet (5 mg total) by mouth every 4 (four) hours as needed for moderate pain ((score 4 to 6)). 04/24/17   Verlinda Boas, PA-C  simvastatin  (ZOCOR ) 20 MG tablet Take 20 mg by mouth every evening. 07/29/16   [provider]  TURMERIC PO Take 1 capsule by mouth 2 (two) times daily.    [provider]    Physical Exam: Vitals:   09/27/23 0800 09/27/23 0900 09/27/23 0930 09/27/23 1124  BP: 93/80 (!) 100/54 (!) 107/40 (!) 107/48  Pulse: 83 71 76 81  Resp: 17 18 (!) 21 18  Temp:      TempSrc:      SpO2: 99% 94% 96% 98%  Weight:      Height:       General:  Appears calm and comfortable and is in NAD Eyes:  PERRL, EOMI, normal lids, iris ENT:  grossly normal hearing, lips & tongue, mmm Neck:  no LAD, masses or thyromegaly Cardiovascular:  RRR. No LE edema.  Respiratory:   CTA bilaterally with no wheezes/rales/rhonchi.  Normal respiratory effort. Abdomen:  soft, NT, ND Skin:  no rash or induration seen on limited exam Musculoskeletal:  grossly normal tone BUE/BLE, good ROM, no bony abnormality Psychiatric:  grossly normal mood and affect, speech fluent and appropriate, AOx3 Neurologic:  CN 2-12 grossly intact, moves all extremities in coordinated fashion   Radiological Exams on Admission: Independently  reviewed - see discussion in A/P where applicable  No results found.  EKG: pending   Labs on Admission: I have personally reviewed the available labs and imaging studies at the time of the admission.  Pertinent labs:    CO2 20 Glucose 161 BUN 64/Creatinine 1.75/GFR 28, down from 52/1.47/35 on 5/5 WBC 11 Hgb 11.9 -> 9.9   Assessment and Plan: Principal Problem:   Acute lower GI bleeding Active Problems:   A-fib (HCC)   High cholesterol   Hypertension   Sleep apnea   Stage 3b chronic kidney disease (HCC)   Obesity, Class III, BMI 40-49.9 (morbid obesity)   DNR (do not resuscitate)  Lower GI Bleeding Differential to include bleeding hemorrhoids; bacterial infectious colitis with likely pathogen Campylobacter jejuni; colonic diverticula Bleeding is likely at least contributed to by Eliquis  She is febrile at this time without tachycardia, but with leukocytosis; stool pathogen panel is pending Will admit to telephone Continue to monitor for recurrent bleeding Her renal function precludes CTA A tagged RBC scan has been ordered to try to determine the most likely source of the bleeding  GI consult has been requested  ABLA Patient's lightheadedness overnight was most likely caused by anemia secondary to lower GI bleeding.  Her Hgb decreased from 11.9 on arrival to 9.8 3 hours later  Type and screen were done in ED.  Monitor closely and follow cbc q12h, transfuse as necessary for Hbg <7   AKI on stage 3b CKD Baseline creatinine is 1.47/GFR 35  Today's creatinine is 1.75/GFR 28   Likely due to prerenal failure secondary to dehydration in the setting of GI bleeding -Hold diuretics for now -IVF as above -Check FeNa -US -renal -Follow up renal function by BMP -Avoid ACEI and NSAIDs   Afib On Eliquis , which is likely a contributing factor Will hold  HTN Continue amlodipine  Hold lisinopril  Will add prn hydralazine  HLD Continue simvastatin   [name] Will order  CPAP  Morbid/class 3 obesity Body mass index is 41.04 kg/m.SABRA  Weight loss should be encouraged Outpatient PCP/bariatric medicine f/u encouraged Significantly low or high BMI is associated with higher medical risk including morbidity and mortality   DNR I have discussed code status with the patient/family and they are in agreement that the patient would not desire resuscitation and would prefer to die a natural death should that situation arise.     Advance Care Planning:   Code Status: Limited: Do not attempt resuscitation (DNR) -DNR-LIMITED -Do Not Intubate/DNI    Consults: GI  DVT Prophylaxis: SCDs  Family Communication: Daughter was present throughout evaluation  Severity of Illness: The appropriate patient status for this patient is INPATIENT. Inpatient status is judged to be reasonable and necessary in order to provide the required intensity of service to ensure the patient's safety. The patient's presenting symptoms, physical exam findings, and initial radiographic and laboratory data in the context of their chronic comorbidities is felt to place them at high risk for further clinical deterioration. Furthermore, it is not anticipated that the patient will be medically stable for discharge from the hospital within 2 midnights of admission.   * I certify that at the point of admission it is my clinical judgment that the patient will require inpatient hospital care spanning beyond 2 midnights from the point of admission due to high intensity of service, high risk for further deterioration and high frequency of surveillance required.*  Author: Delon Herald, MD 09/27/2023 1:52 PM  For on call review [email or web address].

## 2023-09-27 NOTE — Consult Note (Signed)
 Stacy Copping, MD [facility]  [address], [address] [place], [place] [address] Phone: [phone] Fax : [phone]  Consultation  Referring Provider:     Dr. Barbarann Primary Care Physician:  Rudolpho Norleen BIRCH, MD Primary Gastroenterologist: Sampson         Reason for Consultation:     GI bleeding  Date of Admission:  09/27/2023 Date of Consultation:  09/27/2023         HPI:   Stacy Conway is a 89 y.o. female who came to the emergency room with a history of atrial fibrillation who is presently taking Eliquis .  The patient had reported 4 days of dark stools followed by bright red blood per rectum that started this morning.  The patient felt lightheaded and was concerned about the rectal bleeding.  She has not had any abdominal pain nausea vomiting fevers or chills.  I was called by the ED physician about this patient's possible GI bleed and it was noted that the patient also had a drop in her hemoglobin.  The patient's hemoglobin was 12.6 a year ago and on admission it was 11.9.  3 hours later it went down to 9.9.  She reports that her last bowel movement of bright red blood per rectum was 4 AM.  She reports that she has a history of irritable bowel syndrome with diarrhea predominance. The patient was requested to undergo a tagged red blood cell scan due to acute kidney injury thereby not giving the option of contrast for a CT angiography. The patient was seen while getting the bleeding scan and reported that she was not having any abdominal pain at the present time and has not been taking any anti-inflammatory medications whether prescribed or over-the-counter.  The patient is on blood thinners for atrial fibrillation.  Past Medical History:  Diagnosis Date   A-fib [facility] 2)    Arthritis    DNR (do not resuscitate) 09/27/2023   High cholesterol    Hypertension    Obesity, Class III, BMI 40-49.9 (morbid obesity) 09/27/2023   Sleep apnea    Stage 3b chronic kidney disease (HCC)    ckd     Past Surgical History:  Procedure Laterality Date   ABDOMINAL HYSTERECTOMY     BOWEL RESECTION     PARATHYROIDECTOMY  2012   SHOULDER SURGERY     TOTAL HIP ARTHROPLASTY Left 04/22/2017   Procedure: TOTAL HIP ARTHROPLASTY ANTERIOR APPROACH;  Surgeon: Kathlynn Sharper, MD;  Location: [facility] ORS;  Service: Orthopedics;  Laterality: Left;    Prior to Admission medications   Medication Sig Start Date End Date Taking? Authorizing Provider  amLODipine  (NORVASC ) 10 MG tablet Take 10 mg by mouth every evening.  07/23/15  Yes [provider]  apixaban  (ELIQUIS ) 5 MG TABS tablet Take 5 mg by mouth 2 (two) times daily.   Yes [provider]  furosemide  (LASIX ) 40 MG tablet Take 40 mg by mouth daily as needed for fluid.    Yes [provider]  lisinopril  (PRINIVIL ,ZESTRIL ) 20 MG tablet Take 20 mg by mouth every evening.  07/29/16  Yes [provider]  simvastatin  (ZOCOR ) 20 MG tablet Take 20 mg by mouth every evening. 07/29/16  Yes [provider]    History reviewed. No pertinent family history.   Social History   Tobacco Use   Smoking status: Former   Smokeless tobacco: Never   Tobacco comments:    50 years ago  Vaping Use   Vaping status: Never Used  Substance Use Topics   Alcohol use: No   Drug use: No    Allergies as of 09/27/2023 - Review Complete 09/27/2023  Allergen Reaction Noted   Levofloxacin Shortness Of Breath 05/25/2014   Aspirin  Diarrhea and Other (See Comments) 08/03/2016   Nitrofurantoin Nausea Only 12/03/2021   Nsaids Diarrhea 08/03/2016   Scallops [shellfish allergy] Nausea And Vomiting 04/15/2017   Penicillins Itching, Rash, and Other (See Comments) 08/03/2016   Sulfa antibiotics Itching and Rash 08/03/2016    Review of Systems:    All systems reviewed and negative except where noted in HPI.   Physical Exam:  Vital signs in last 24 hours: Temp:  [100.7 F (38.2 C)] 100.7 F (38.2 C) (06/23 0648) Pulse Rate:   [71-97] 81 (06/23 1124) Resp:  [15-21] 18 (06/23 1124) BP: (93-140)/(40-103) 107/48 (06/23 1124) SpO2:  [94 %-100 %] 98 % (06/23 1124) Weight:  [98.5 kg] 98.5 kg (06/23 0650)   General:   Pleasant, cooperative in NAD Head:  Normocephalic and atraumatic. Eyes:   No icterus.   Conjunctiva pink. PERRLA. Ears:  Normal auditory acuity. Rectal:  Not performed. Msk:  Symmetrical without gross deformities.    Neurologic:  Alert and oriented x3;  grossly normal neurologically. Skin:  Intact without significant lesions or rashes. Psych:  Alert and cooperative. Normal affect.  LAB RESULTS: Recent Labs    09/27/23 0653 09/27/23 0948  WBC 11.0* 16.9*  HGB 11.9* 9.8*  9.9*  HCT 38.3 31.4*  31.6*  PLT 270 239   BMET Recent Labs    09/27/23 0653  NA 137  K 4.6  CL 108  CO2 20*  GLUCOSE 161*  BUN 64*  CREATININE 1.75*  CALCIUM 9.0   LFT Recent Labs    09/27/23 0653  PROT 7.5  ALBUMIN 4.3  AST 23  ALT 19  ALKPHOS 72  BILITOT 0.7   PT/INR Recent Labs    09/27/23 0653  LABPROT 17.4*  INR 1.4*    STUDIES: No results found.    Impression / Plan:   Assessment: Principal Problem:   Acute lower GI bleeding Active Problems:   A-fib (HCC)   High cholesterol   Hypertension   Sleep apnea   Stage 3b chronic kidney disease (HCC)   Obesity, Class III, BMI 40-49.9 (morbid obesity)   DNR (do not resuscitate)   Stacy Conway is a 89 y.o. y/o female with GI bleeding that started out as melena that she now reports to be bright red blood per rectum.  The patient has no abdominal pain and has not been on any anti-inflammatory medication.  The patient is undergoing a red tagged cell scan at the present time.  She has not had a bowel movement since 4:00 in the morning and states that she does not feel like she has to have a bowel movement.  Plan:  I will await the results of the bleeding scan before deciding on the next step.  It would be more desirable if the patient did  have the procedure after she has had her anticoagulation washout.  The patient may need to undergo a luminal evaluation when a source is found.  The patient has been explained the plan and agrees with it.  Thank you for involving me in the care of this patient.      LOS: 0 days   Stacy Copping, MD, MD. NOLIA 09/27/2023, 3:07 PM,  Pager [phone] 7am-5pm  Check AMION for 5pm -7am coverage and on weekends   Note:  This dictation was prepared with Dragon dictation along with smaller phrase technology. Any transcriptional errors that result from this process are unintentional.

## 2023-09-27 NOTE — Progress Notes (Signed)
       CROSS COVER NOTE  NAME: Stacy Conway MRN: [id number] DOB : 17-Nov-1934    Concern as stated by nurse / staff   Patient had a 1.86 second of SVR HR dropped to 38 per CCMD, did nonsustained. No complaints of pain.      Pertinent findings on chart review: H&P done earlier in the day reviewed: history significant of afib on Eliquis , HLD, HTN, [name], morbid obesity, and stage 3b CKD who presented on 6/23 with rectal bleeding   Patient Assessment   Assessment and  Interventions   Assessment:  Sinus pause-not currently on rate control agents  Plan: Continue to monitor on telemetry overnight If recurrent or symptomatic, can consider cardiology consult in the a.m. X

## 2023-09-27 NOTE — ED Notes (Signed)
Informed RN bed assigned 

## 2023-09-27 NOTE — Progress Notes (Signed)
 Pt refusing CPAP unit because he does not wear a unit at home. RT will cont to follow as needed.

## 2023-09-28 ENCOUNTER — Inpatient Hospital Stay: Admitting: Anesthesiology

## 2023-09-28 ENCOUNTER — Encounter: Payer: Self-pay | Admitting: Internal Medicine

## 2023-09-28 ENCOUNTER — Inpatient Hospital Stay

## 2023-09-28 ENCOUNTER — Encounter
Admission: EM | Disposition: A | Discharge status: Home Health | Payer: Self-pay | Source: Home / Self Care | Attending: Student

## 2023-09-28 DIAGNOSIS — K922 Gastrointestinal hemorrhage, unspecified: Principal | ICD-10-CM

## 2023-09-28 DIAGNOSIS — K319 Disease of stomach and duodenum, unspecified: Secondary | ICD-10-CM

## 2023-09-28 DIAGNOSIS — K449 Diaphragmatic hernia without obstruction or gangrene: Secondary | ICD-10-CM | POA: Diagnosis not present

## 2023-09-28 DIAGNOSIS — K920 Hematemesis: Secondary | ICD-10-CM | POA: Diagnosis not present

## 2023-09-28 HISTORY — PX: ESOPHAGOGASTRODUODENOSCOPY: SHX5428

## 2023-09-28 LAB — BASIC METABOLIC PANEL WITH GFR
Anion gap: 6 (ref 5–15)
Anion gap: 7 (ref 5–15)
BUN: 58 mg/dL — ABNORMAL HIGH (ref 8–23)
BUN: 52 mg/dL — ABNORMAL HIGH (ref 8–23)
CO2: 22 mmol/L (ref 22–32)
CO2: 22 mmol/L (ref 22–32)
Calcium: 8.2 mg/dL — ABNORMAL LOW (ref 8.9–10.3)
Calcium: 8.7 mg/dL — ABNORMAL LOW (ref 8.9–10.3)
Chloride: 111 mmol/L (ref 98–111)
Chloride: 109 mmol/L (ref 98–111)
Creatinine, Ser: 1.48 mg/dL — ABNORMAL HIGH (ref 0.44–1.00)
Creatinine, Ser: 1.42 mg/dL — ABNORMAL HIGH (ref 0.44–1.00)
GFR, Estimated: 34 mL/min — ABNORMAL LOW (ref >60)
GFR, Estimated: 36 mL/min — ABNORMAL LOW (ref >60)
Glucose, Bld: 92 mg/dL (ref 70–99)
Glucose, Bld: 107 mg/dL — ABNORMAL HIGH (ref 70–99)
Potassium: 5.4 mmol/L — ABNORMAL HIGH (ref 3.5–5.1)
Potassium: 5.1 mmol/L (ref 3.5–5.1)
Sodium: 139 mmol/L (ref 135–145)
Sodium: 138 mmol/L (ref 135–145)

## 2023-09-28 LAB — IRON AND TIBC
Iron: 40 ug/dL (ref 28–170)
Saturation Ratios: 12 % (ref 10.4–31.8)
TIBC: 335 ug/dL (ref 250–450)
UIBC: 295 ug/dL

## 2023-09-28 LAB — CBC
HCT: 29.1 % — ABNORMAL LOW (ref 36.0–46.0)
HCT: 31.5 % — ABNORMAL LOW (ref 36.0–46.0)
HCT: 32.5 % — ABNORMAL LOW (ref 36.0–46.0)
Hemoglobin: 9.2 g/dL — ABNORMAL LOW (ref 12.0–15.0)
Hemoglobin: 9.9 g/dL — ABNORMAL LOW (ref 12.0–15.0)
Hemoglobin: 10.4 g/dL — ABNORMAL LOW (ref 12.0–15.0)
MCH: 29 pg (ref 26.0–34.0)
MCH: 28.8 pg (ref 26.0–34.0)
MCH: 29.1 pg (ref 26.0–34.0)
MCHC: 31.6 g/dL (ref 30.0–36.0)
MCHC: 31.4 g/dL (ref 30.0–36.0)
MCHC: 32 g/dL (ref 30.0–36.0)
MCV: 91.8 fL (ref 80.0–100.0)
MCV: 91.6 fL (ref 80.0–100.0)
MCV: 91 fL (ref 80.0–100.0)
Platelets: 199 10*3/uL (ref 150–400)
Platelets: 227 10*3/uL (ref 150–400)
Platelets: 231 10*3/uL (ref 150–400)
RBC: 3.17 MIL/uL — ABNORMAL LOW (ref 3.87–5.11)
RBC: 3.44 MIL/uL — ABNORMAL LOW (ref 3.87–5.11)
RBC: 3.57 MIL/uL — ABNORMAL LOW (ref 3.87–5.11)
RDW: 17 % — ABNORMAL HIGH (ref 11.5–15.5)
RDW: 17 % — ABNORMAL HIGH (ref 11.5–15.5)
RDW: 17 % — ABNORMAL HIGH (ref 11.5–15.5)
WBC: 15.2 10*3/uL — ABNORMAL HIGH (ref 4.0–10.5)
WBC: 14.5 10*3/uL — ABNORMAL HIGH (ref 4.0–10.5)
WBC: 14.1 10*3/uL — ABNORMAL HIGH (ref 4.0–10.5)
nRBC: 0 % (ref 0.0–0.2)
nRBC: 0 % (ref 0.0–0.2)
nRBC: 0 % (ref 0.0–0.2)

## 2023-09-28 LAB — FOLATE: Folate: >40 ng/mL (ref >5.9)

## 2023-09-28 LAB — TSH: TSH: 1.477 u[IU]/mL (ref 0.350–4.500)

## 2023-09-28 LAB — VITAMIN B12: Vitamin B-12: 689 pg/mL (ref 180–914)

## 2023-09-28 LAB — VITAMIN D 25 HYDROXY (VIT D DEFICIENCY, FRACTURES): Vit D, 25-Hydroxy: 76.07 ng/mL (ref 30–100)

## 2023-09-28 LAB — MAGNESIUM: Magnesium: 2.5 mg/dL — ABNORMAL HIGH (ref 1.7–2.4)

## 2023-09-28 LAB — PHOSPHORUS: Phosphorus: 3.4 mg/dL (ref 2.5–4.6)

## 2023-09-28 SURGERY — EGD (ESOPHAGOGASTRODUODENOSCOPY)
Anesthesia: General

## 2023-09-28 MED ORDER — PROPOFOL 1000 MG/100ML IV EMUL
INTRAVENOUS | Status: AC
  Filled 2023-09-28: qty 100

## 2023-09-28 MED ORDER — PEG 3350-KCL-NA BICARB-NACL 420 G PO SOLR
4000.0000 mL | Freq: Once | ORAL | Status: AC
  Administered 2023-09-28: 4000 mL via ORAL
  Filled 2023-09-28: qty 4000

## 2023-09-28 MED ORDER — PROPOFOL 10 MG/ML IV BOLUS
INTRAVENOUS | Status: DC | PRN
  Administered 2023-09-28: 60 mg via INTRAVENOUS
  Administered 2023-09-28: 30 mg via INTRAVENOUS

## 2023-09-28 MED ORDER — PROPOFOL 500 MG/50ML IV EMUL
INTRAVENOUS | Status: DC | PRN
  Administered 2023-09-28: 100 ug/kg/min via INTRAVENOUS

## 2023-09-28 MED ORDER — LIDOCAINE HCL (CARDIAC) PF 100 MG/5ML IV SOSY
PREFILLED_SYRINGE | INTRAVENOUS | Status: DC | PRN
  Administered 2023-09-28: 50 mg via INTRAVENOUS

## 2023-09-28 NOTE — TOC Initial Note (Signed)
 Transition of Care [facility]) - Initial/Assessment Note    Patient Details  Name: Stacy Conway MRN: [id number] Date of Birth: December 08, 1934  [facility] of [facility]) CM/SW Contact:    Stacy GORMAN Fuse, RN Phone Number: 09/28/2023, 3:13 PM  Clinical Narrative:     Patient is from home with her husband. Her husband has care givers who assist with his care. The patient has good family support from her daughter Stacy Conway. Her Stacy Conway drives her to and from appts and to run errands. She has been pretty mobile.   Her PCP is Dr. Rudolpho and her RX is [facility]. She also uses [facility].  She has used [facility] in the past.              Expected Discharge Plan: Home w Home Health Services Barriers to Discharge: Continued Medical Work up   Patient Goals and CMS Choice            Expected Discharge Plan and Services   Discharge Planning Services: CM Consult   Living arrangements for the past 2 months: Single Family Home                                      Prior Living Arrangements/Services Living arrangements for the past 2 months: Single Family Home Lives with:: Spouse              Current home services: DME (walker, cane, raised toilet seat)    Activities of Daily Living      Permission Sought/Granted                  Emotional Assessment       Orientation: : Oriented to Self, Oriented to Place, Oriented to  Time, Oriented to Situation   Psych Involvement: No (comment)  Admission diagnosis:  Acute lower GI bleeding [K92.2] Acute GI bleeding [K92.2] Anemia, unspecified type [D64.9] Patient Active Problem List   Diagnosis Date Noted   Acute GI bleeding 09/28/2023   Acute lower GI bleeding 09/27/2023   Obesity, Class III, BMI 40-49.9 (morbid obesity) 09/27/2023   DNR (do not resuscitate) 09/27/2023   A-fib (HCC)    High cholesterol    Hypertension    Sleep apnea    Stage 3b chronic kidney disease (HCC)    Primary localized osteoarthritis of left  hip 04/22/2017   PCP:  Stacy Conway Norleen BIRCH, MD Pharmacy:   [facility] [phone] [place] [place], [place] - [address] AT [facility] [address] [place] [address] [place] [address] Phone: [phone] Fax: [phone]     Social Drivers of Health (SDOH) Social History: SDOH Screenings   Food Insecurity: No Food Insecurity (09/27/2023)  Housing: Low Risk  (09/27/2023)  Transportation Needs: No Transportation Needs (09/27/2023)  Utilities: Not At Risk (09/27/2023)  Social Connections: Moderately Isolated (09/27/2023)  Tobacco Use: Medium Risk (09/27/2023)   SDOH Interventions:     Readmission Risk Interventions     No data to display

## 2023-09-28 NOTE — Transfer of Care (Signed)
 Immediate Anesthesia Transfer of Care Note  Patient: Stacy Conway  Procedure(s) Performed: EGD (ESOPHAGOGASTRODUODENOSCOPY)  Patient Location: Endoscopy Unit  Anesthesia Type:General  Level of Consciousness: drowsy and patient cooperative  Airway & Oxygen Therapy: Patient Spontanous Breathing  Post-op Assessment: Report given to RN, Post -op Vital signs reviewed and stable, and Patient moving all extremities X 4  Post vital signs: Reviewed and stable  Last Vitals:  Vitals Value Taken Time  BP 101/37 09/28/23 13:29  Temp    Pulse 59 09/28/23 13:30  Resp 20 09/28/23 13:30  SpO2 96 % 09/28/23 13:30  Vitals shown include unfiled device data.  Last Pain:  Vitals:   09/28/23 1254  TempSrc: Temporal  PainSc: 0-No pain         Complications: No notable events documented.

## 2023-09-28 NOTE — Progress Notes (Signed)
 [facility] Progress Note  Patient: Stacy Conway    FMW:[id number]  DOA: 09/27/2023     Date of Service: the patient was seen and examined on 09/28/2023  Chief Complaint  Patient presents with   Rectal Bleeding   Brief hospital course: Stacy Conway is a 89 y.o. female with medical history significant of afib on Eliquis , HLD, HTN, [name], morbid obesity, and stage 3b CKD who presented on 6/23 with rectal bleeding.  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     ER Course:   GI bleeding.  Spoken to Dr. Jinny - suggests tagged RBC scan (no CTA due to AKI on CKD).  On Eliquis  for afib.  Hgb 11.9 -> 9.9.  Temp to 100.7.  Stool sample ordered for GI pathogens.   Assessment and Plan:  # GI bleeding, unknown source Her renal function precludes CTA  Tagged RBC scan negative for acute bleeding. Monitor H&H Hb 9.9 today GI consulted, s/p EGD, small hiatal hernia, hematin in the gastric fundus, erosive gastropathy with no stigmata of recent bleeding.  Normal duodenum.  Specimen collected. Colonoscopy pending Patient was seen before the procedure in the morning   # Acute blood loss anemia Baseline Hb 11.9 Hb 9.9 today Iron profile and folate within normal range Follow B12 level  # Afib On Eliquis , which is likely a contributing factor.  Held Eliquis  for now History of pauses, patient is having pauses on the telemetry Cardiology consulted, patient was admitted for EGD  Continue to monitor on telemetry   # HTN Continue amlodipine  Hold lisinopril  Will add prn hydralazine   # HLD: Continue simvastatin     # AKI on stage 3b CKD Baseline creatinine is 1.47/GFR 35  sCr 1.75--1.42 Likely due to prerenal failure secondary to dehydration in the setting of GI bleeding -Hold diuretics for now -IVF as above -US -renal -Follow up renal function by BMP -Avoid ACEI and NSAIDs   # [name]: Continue CPAP # Obesity class III, morbid obesity Body mass index is 41.04 kg/m.   Interventions:  Diet: Heart healthy diet DVT Prophylaxis: SCD, no heparin/Lovenox due to GI bleed  Advance goals of care discussion: DNR-limited  Family Communication: family was present at bedside, at the time of interview.  The pt provided permission to discuss medical plan with the family. Opportunity was given to ask question and all questions were answered satisfactorily.   Disposition:  Pt is from Home, admitted with GI bleeding, still has risk of bleeding, pending GI workup, which precludes a safe discharge. Discharge to home, when stable, cleared by GI and cardiology.  Subjective: Overnight patient had multiple pauses on the telemetry, remained asymptomatic.  In the morning time patient was resting comfortably, denied any chest pain or pressure, no shortness of breath.  Denied any active GI bleeding at this time.  Physical Exam: General: NAD, lying comfortably Appear in no distress, affect appropriate Eyes: PERRLA ENT: Oral Mucosa Clear, moist  Neck: no JVD,  Cardiovascular: Irregular rhythm, no Murmur,  Respiratory: good respiratory effort, Bilateral Air entry equal and Decreased, no Crackles, no wheezes Abdomen: Bowel Sound present, Soft and no tenderness,  Skin: no rashes Extremities: no Pedal edema, no calf tenderness Neurologic: without any new focal findings Gait not checked due to patient safety concerns  Vitals:   09/28/23 1141 09/28/23 1254 09/28/23 1329 09/28/23 1343  BP: (!) 157/58 (!) 138/56 (!) 101/37 136/60  Pulse: (!) 56 60 60 66  Resp: 18 16 18 14   Temp: (!) 97.5 F (36.4  C) 97.6 F (36.4 C)  97.8 F (36.6 C)  TempSrc:  Temporal  Temporal  SpO2: 96% 97% 93% 97%  Weight:      Height:        Intake/Output Summary (Last 24 hours) at 09/28/2023 1349 Last data filed at 09/27/2023 1900 Gross per 24 hour  Intake 628.73 ml  Output --  Net 628.73 ml   Filed Weights   09/27/23 0650  Weight: 98.5 kg    Data Reviewed: I have personally reviewed  and interpreted daily labs, tele strips, imagings as discussed above. I reviewed all nursing notes, pharmacy notes, vitals, pertinent old records I have discussed plan of care as described above with RN and patient/family.  CBC: Recent Labs  Lab 09/27/23 0948 09/27/23 1559 09/27/23 2149 09/28/23 0328 09/28/23 1000  WBC 16.9* 25.5* 19.8* 15.2* 14.5*  HGB 9.8*  9.9* 10.2* 9.3* 9.2* 9.9*  HCT 31.4*  31.6* 32.7* 27.8* 29.1* 31.5*  MCV 91.0 92.1 88.8 91.8 91.6  PLT 239 247 219 199 227   Basic Metabolic Panel: Recent Labs  Lab 09/27/23 0653 09/28/23 0328 09/28/23 1000  NA 137 139 138  K 4.6 5.4* 5.1  CL 108 111 109  CO2 20* 22 22  GLUCOSE 161* 92 107*  BUN 64* 58* 52*  CREATININE 1.75* 1.48* 1.42*  CALCIUM 9.0 8.2* 8.7*  MG  --   --  2.5*  PHOS  --   --  3.4    Studies: NM GI Blood Loss Result Date: 09/27/2023 CLINICAL DATA:  GI bleeding EXAM: NUCLEAR MEDICINE GASTROINTESTINAL BLEEDING SCAN TECHNIQUE: Sequential abdominal images were obtained following intravenous administration of Tc-45m labeled red blood cells. RADIOPHARMACEUTICALS:  24.4 mCi Tc-30m pertechnetate in-vitro labeled red cells. COMPARISON:  None Available. FINDINGS: There is normal prompt uptake of radiotracer into the vascular system. There is no active gastrointestinal bleeding identified. Imaging was performed over a 2 hour time frame. IMPRESSION: No active gastrointestinal bleeding identified Electronically Signed   By: Greig Pique M.D.   On: 09/27/2023 15:50    Scheduled Meds:  [MAR Hold] amLODipine   10 mg Oral QPM   polyethylene glycol-electrolytes  4,000 mL Oral Once   [MAR Hold] simvastatin   20 mg Oral QPM   [MAR Hold] sodium chloride  flush  3 mL Intravenous Q12H   Continuous Infusions:  lactated ringers  100 mL/hr at 09/28/23 1310   PRN Meds: [MAR Hold] acetaminophen  **OR** [MAR Hold] acetaminophen , [MAR Hold] hydrALAZINE, [MAR Hold]  morphine  injection, [MAR Hold] ondansetron  **OR** [MAR Hold]  ondansetron  (ZOFRAN ) IV  Time spent: 55 minutes  Author: ELVAN SOR. MD [facility] 09/28/2023 1:49 PM  To reach On-call, see care teams to locate the attending and reach out to them via [email or web address]. If 7PM-7AM, please contact night-coverage If you still have difficulty reaching the attending provider, please page the [facility] (Director on Call) for [facility] on amion for assistance.

## 2023-09-28 NOTE — Plan of Care (Signed)

## 2023-09-28 NOTE — Anesthesia Preprocedure Evaluation (Addendum)
 Anesthesia Evaluation  Patient identified by MRN, date of birth, ID band Patient awake    Reviewed: Allergy & Precautions, H&P , NPO status , Patient's Chart, lab work & pertinent test results  Airway Mallampati: III  TM Distance: >3 FB Neck ROM: full    Dental  (+) Poor Dentition, Missing   Pulmonary sleep apnea , former smoker   Pulmonary exam normal        Cardiovascular hypertension, + dysrhythmias (history of sinus bradycardia and pauses per previous holter monitor) Atrial Fibrillation + Valvular Problems/Murmurs MR  Rhythm:Irregular Rate:Normal  ECHO   03/16/2023 NORMAL LEFT VENTRICULAR SYSTOLIC FUNCTION WITH MILD LVH  ESTIMATED EF: >55%  NORMAL LA PRESSURES WITH NORMAL DIASTOLIC FUNCTION  NORMAL RIGHT VENTRICULAR SYSTOLIC FUNCTION  VALVULAR REGURGITATION: MILD AR, MODERATE MR, TRIVIAL PR, MODERATE TR  NO VALVULAR STENOSIS    TELEMETRY reviewed by me 09/28/2023: Atrial fibrillation, rate 60s.    Neuro/Psych negative neurological ROS  negative psych ROS   GI/Hepatic negative GI ROS, Neg liver ROS,,,  Endo/Other  negative endocrine ROS    Renal/GU Renal InsufficiencyRenal disease  negative genitourinary   Musculoskeletal   Abdominal  (+) + obese  Peds  Hematology  (+) Blood dyscrasia, anemia Hgb 9.9 this AM.   Anesthesia Other Findings Past Medical History: No date: A-fib [facility]) No date: Arthritis 09/27/2023: DNR (do not resuscitate) No date: High cholesterol No date: Hypertension 09/27/2023: Obesity, Class III, BMI 40-49.9 (morbid obesity) No date: Sleep apnea No date: Stage 3b chronic kidney disease (HCC)     Comment:  ckd  Past Surgical History: No date: ABDOMINAL HYSTERECTOMY No date: BOWEL RESECTION 2012: PARATHYROIDECTOMY No date: SHOULDER SURGERY 04/22/2017: TOTAL HIP ARTHROPLASTY; Left     Comment:  Procedure: TOTAL HIP ARTHROPLASTY ANTERIOR APPROACH;                Surgeon: Kathlynn Sharper,  MD;  Location: [facility] ORS;                Service: Orthopedics;  Laterality: Left;  BMI    Body Mass Index: 41.04 kg/m      Reproductive/Obstetrics negative OB ROS                              Anesthesia Physical Anesthesia Plan  ASA: 3  Anesthesia Plan: General   Post-op Pain Management: Minimal or no pain anticipated   Induction: Intravenous  PONV Risk Score and Plan: Propofol  infusion and TIVA  Airway Management Planned: Natural Airway  Additional Equipment:   Intra-op Plan:   Post-operative Plan:   Informed Consent: I have reviewed the patients History and Physical, chart, labs and discussed the procedure including the risks, benefits and alternatives for the proposed anesthesia with the patient or authorized representative who has indicated his/her understanding and acceptance.   Patient has DNR.  Discussed DNR with patient and Suspend DNR.   Dental Advisory Given  Plan Discussed with: CRNA and Surgeon  Anesthesia Plan Comments: (Daughter was present during consent)        Anesthesia Quick Evaluation

## 2023-09-28 NOTE — Consult Note (Signed)
 [facility] CARDIOLOGY CONSULT NOTE       Patient ID: Stacy Conway MRN: [id number] DOB/AGE: Jan 08, 1935 89 y.o.  Admit date: 09/27/2023 Referring Physician Dr. Von Primary Physician Rudolpho Norleen BIRCH, [name], pause; [name], hypertension, paroxymal atrial fibrillation, pure hypercholesteremia, [name], obesity, CKD stage 3b who presented to the ED on 09/27/2023 for rectal bleeding, [name]  Patient presented to the ED with bright red blood per rectum that started morning of 06/23. Work up in the ED notable for Na 137, K 4.6, Cr 1.75, Hgb 11.9. Hgb trending down. NM GI bleeding scan reveals no active GI bleeding. EKG with atrial fibrillation rate 60s without acute ischemic changes.   At the time of my evaluation this AM, patient was resting comfortably in hospital bed with family at bedside. We discussed patients sxs in further detail. Patient states she came into ED due to having bright red blood per rectum. Patient states she has hx of bradycardia and atrial fibrillation. Patient states she's been feeling good from cardiac standpoint. Patient denies any chest pain, palpitations, SOB, lightheadedness, dizziness, falls or syncope.   Pertinent Cardiac History (Most recent) -13 day Holter monitor from 12/7-12/21/2023, which revealed predominant sinus bradycardia with mean heart rate of 51 bpm, sinus heart rate range 21 to 86 bpm. Frequent pauses were observed the longest lasting 5.68 seconds.  There were 3 brief atrial runs the longest lasting 3 beats. There were no patient entries. The patient was taking atenolol  12.5 mg daily during this time. While wearing the Holter monitor, she took atenolol  for part of the time, but then  stopped it altogether. The period of time that she took atenolol  is unclear. She states that when she took a quarter tablet, it took about 3 days for her bradycardia to resolve.   -Repeat 72-hour Holter monitor 04/15/2022 - 04/18/2022, off of atenolol , revealed predominant sinus bradycardia with mean heart rate of 58 bpm, sinus heart rate range 34 to 89 bpm. There were no diary entries.   Review of systems complete and found to be negative unless listed above    Past Medical History:  Diagnosis Date   A-fib [facility])    Arthritis    DNR (do not resuscitate) 09/27/2023   High cholesterol    Hypertension    Obesity, Class III, BMI 40-49.9 (morbid obesity) 09/27/2023   Sleep apnea    Stage 3b chronic kidney disease (HCC)    ckd    Past Surgical History:  Procedure Laterality Date   ABDOMINAL HYSTERECTOMY     BOWEL RESECTION     PARATHYROIDECTOMY  2012   SHOULDER SURGERY     TOTAL HIP ARTHROPLASTY Left 04/22/2017   Procedure: TOTAL HIP ARTHROPLASTY ANTERIOR APPROACH;  Surgeon: Kathlynn Sharper, MD;  Location: [facility] ORS;  Service: Orthopedics;  Laterality: Left;    Medications Prior to Admission  Medication Sig Dispense Refill Last Dose/Taking   amLODipine  (NORVASC ) 10 MG tablet Take 10 mg by mouth every evening.    09/26/2023 Evening   apixaban  (ELIQUIS ) 5 MG TABS tablet Take 5 mg by mouth 2 (two) times daily.   09/26/2023 Evening   furosemide  (LASIX ) 40 MG tablet Take 40 mg by mouth daily as needed for fluid.    Taking As Needed  lisinopril  (PRINIVIL ,ZESTRIL ) 20 MG tablet Take 20 mg by mouth every evening.    09/26/2023 Evening   simvastatin  (ZOCOR ) 20 MG tablet Take 20 mg by mouth every evening.   09/26/2023 Evening   Social History   Socioeconomic History   Marital status: Married    Spouse name: Not on file   Number of children: Not on file   Years of education: Not on file   Highest education level: Not on file  Occupational History   Not on file  Tobacco Use   Smoking status:  Former   Smokeless tobacco: Never   Tobacco comments:    50 years ago  Vaping Use   Vaping status: Never Used  Substance and Sexual Activity   Alcohol use: No   Drug use: No   Sexual activity: Not on file  Other Topics Concern   Not on file  Social History Narrative   Not on file   Social Drivers of Health   Financial Resource Strain: Not on file  Food Insecurity: No Food Insecurity (09/27/2023)   Hunger Vital Sign    Worried About Running Out of Food in the Last Year: Never true    Ran Out of Food in the Last Year: Never true  Transportation Needs: No Transportation Needs (09/27/2023)   PRAPARE - Administrator, Civil Service (Medical): No    Lack of Transportation (Non-Medical): No  Physical Activity: Not on file  Stress: Not on file  Social Connections: Moderately Isolated (09/27/2023)   Social Connection and Isolation Panel    Frequency of Communication with Friends and Family: More than three times a week    Frequency of Social Gatherings with Friends and Family: More than three times a week    Attends Religious Services: Never    Database administrator or Organizations: No    Attends Banker Meetings: Never    Marital Status: Married  Catering manager Violence: Unknown (09/27/2023)   Humiliation, Afraid, Rape, and Kick questionnaire    Fear of Current or Ex-Partner: No    Emotionally Abused: No    Physically Abused: Not on file    Sexually Abused: No    History reviewed. No pertinent family history.   Vitals:   09/27/23 2009 09/28/23 0023 09/28/23 0446 09/28/23 0827  BP: (!) 126/42 (!) 123/49 136/84 (!) 149/53  Pulse: (!) 58 (!) 52 60 (!) 59  Resp: 18   18  Temp: 98 F (36.7 C) 98 F (36.7 C) 98.2 F (36.8 C) 97.6 F (36.4 C)  TempSrc: Oral     SpO2: 94% 95% 95% 94%  Weight:      Height:        PHYSICAL EXAM General: Well appearing female, well nourished, in no acute distress. HEENT: Normocephalic and atraumatic. Neck: No JVD.    Lungs: Normal respiratory effort on room air. Clear bilaterally to auscultation. No wheezes, crackles, rhonchi.  Heart: Irregularly, irregular, slow rates. Normal S1 and S2, + murmur Abdomen: Non-distended appearing.  Msk: Normal strength and tone for age. Extremities: Warm and well perfused. No clubbing, cyanosis. No edema.  Neuro: Alert and oriented X 3. Psych: Answers questions appropriately.   Labs: Basic Metabolic Panel: Recent Labs    09/27/23 0653 09/28/23 0328  NA 137 139  K 4.6 5.4*  CL 108 111  CO2 20* 22  GLUCOSE 161* 92  BUN 64* 58*  CREATININE 1.75* 1.48*  CALCIUM 9.0 8.2*   Liver Function Tests: Recent  Labs    09/27/23 0653  AST 23  ALT 19  ALKPHOS 72  BILITOT 0.7  PROT 7.5  ALBUMIN 4.3   No results for input(s): LIPASE, AMYLASE in the last 72 hours. CBC: Recent Labs    09/27/23 2149 09/28/23 0328  WBC 19.8* 15.2*  HGB 9.3* 9.2*  HCT 27.8* 29.1*  MCV 88.8 91.8  PLT 219 199   Cardiac Enzymes: No results for input(s): CKTOTAL, CKMB, CKMBINDEX, TROPONINIHS in the last 72 hours. BNP: No results for input(s): BNP in the last 72 hours. D-Dimer: No results for input(s): DDIMER in the last 72 hours. Hemoglobin A1C: No results for input(s): HGBA1C in the last 72 hours. Fasting Lipid Panel: No results for input(s): CHOL, HDL, LDLCALC, TRIG, CHOLHDL, LDLDIRECT in the last 72 hours. Thyroid  Function Tests: No results for input(s): TSH, T4TOTAL, T3FREE, THYROIDAB in the last 72 hours.  Invalid input(s): FREET3 Anemia Panel: No results for input(s): VITAMINB12, FOLATE, FERRITIN, TIBC, IRON, RETICCTPCT in the last 72 hours.   Radiology: NM GI Blood Loss Result Date: 09/27/2023 CLINICAL DATA:  GI bleeding EXAM: NUCLEAR MEDICINE GASTROINTESTINAL BLEEDING SCAN TECHNIQUE: Sequential abdominal images were obtained following intravenous administration of Tc-1m labeled red blood cells. RADIOPHARMACEUTICALS:   24.4 mCi Tc-69m pertechnetate in-vitro labeled red cells. COMPARISON:  None Available. FINDINGS: There is normal prompt uptake of radiotracer into the vascular system. There is no active gastrointestinal bleeding identified. Imaging was performed over a 2 hour time frame. IMPRESSION: No active gastrointestinal bleeding identified Electronically Signed   By: Greig Pique M.D.   On: 09/27/2023 15:50    ECHO ordered.  03/16/2023 NORMAL LEFT VENTRICULAR SYSTOLIC FUNCTION WITH MILD LVH  ESTIMATED EF: >55%  NORMAL LA PRESSURES WITH NORMAL DIASTOLIC FUNCTION  NORMAL RIGHT VENTRICULAR SYSTOLIC FUNCTION  VALVULAR REGURGITATION: MILD AR, MODERATE MR, TRIVIAL PR, MODERATE TR  NO VALVULAR STENOSIS   TELEMETRY reviewed by me 09/28/2023: Atrial fibrillation, rate 60s. (Longest pauses 2.96 seconds on tele on 06/24 at 0100)  EKG reviewed by me: ordered.  Data reviewed by me 09/28/2023: last 24h vitals tele labs imaging I/O ED provider note, admission H&P.  Principal Problem:   Acute lower GI bleeding Active Problems:   A-fib (HCC)   High cholesterol   Hypertension   Sleep apnea   Stage 3b chronic kidney disease (HCC)   Obesity, Class III, BMI 40-49.9 (morbid obesity)   DNR (do not resuscitate)    ASSESSMENT AND PLAN:  Norabelle Kondo is a 89 y.o. female  with a past medical history of hx of sinus bradycardia and pauses, hypertension, paroxymal atrial fibrillation, pure hypercholesteremia, [name], obesity, CKD stage 3b who presented to the ED on 09/27/2023 for rectal bleeding, reports bright red blood. Cardiology was consulted for further evaluation of bradycardia and POC for EGD  # Bright red blood per rectum # Anemia Patient presents with bright red blood per rectum. Hgb trending down. Hgb 9.9 this AM. -Plan for EGD with GI today to further evaluate PRBPR.  # Paroxsymal atrial fibrillation # HX Sinus bradycardia  # Hypertension # Hypercholesteremia Prior Echo from 03/2023 with pEF, normal  systolic and diastolic function. EKG this admission and tele with atrial fibrillation, rate 60s. (Longest pauses 2.96 seconds on tele on 06/24 at 0100). As states above, patient has known history of sinus bradycardia and pauses per previous holter monitors. Patient denies cardiac sxs, remains aSX. -Echo ordered. -Monitor and replenish electrolytes for a goal K around 4, Mag around 2  -Home Eliquis  5 mg  BID on hold due to acute GI bleed. Resume when cleared by GI and Hgb stable.  -Avoid AVN blockers -Continue amlodipine  10 mg daily.  -Continue simvastatin  20 mg daily.   Patient is at elevated but acceptable risk for proceeding with surgery given cardiac history understanding risk/benefits.  Can proceed with EGD with Dr. Jinny  without further cardiac diagnostics needed.    This patient's plan of care was discussed and created with Dr. Florencio and he is in agreement.  Signed: Dorene Comfort, PA-C  09/28/2023, 10:09 AM [facility]

## 2023-09-28 NOTE — Anesthesia Postprocedure Evaluation (Signed)
 Anesthesia Post Note  Patient: Stacy Conway  Procedure(s) Performed: EGD (ESOPHAGOGASTRODUODENOSCOPY)  Patient location during evaluation: PACU Anesthesia Type: General Level of consciousness: awake and alert Pain management: pain level controlled Vital Signs Assessment: post-procedure vital signs reviewed and stable Respiratory status: spontaneous breathing, nonlabored ventilation and respiratory function stable Cardiovascular status: blood pressure returned to baseline and stable Postop Assessment: no apparent nausea or vomiting Anesthetic complications: no   No notable events documented.   Last Vitals:  Vitals:   09/28/23 1343 09/28/23 1354  BP: 136/60 (!) 144/57  Pulse: 66 65  Resp: 14 15  Temp: 36.6 C 36.6 C  SpO2: 97% 97%    Last Pain:  Vitals:   09/28/23 1354  TempSrc: Temporal  PainSc: 0-No pain                 Camellia Merilee Louder

## 2023-09-28 NOTE — Op Note (Signed)
 [facility] Gastroenterology Patient Name: Stacy Conway Procedure Date: 09/28/2023 12:52 PM MRN: [id number] Account #: [id number] Date of Birth: Oct 06, 1934 Admit Type: Inpatient Age: 89 Room: [facility] ENDO ROOM 4 Gender: Female Note Status: Finalized Instrument Name: Upper Endoscope [id number] Procedure:             Upper GI endoscopy Indications:           Melena Providers:             Rogelia Copping MD, MD Referring MD:          Norleen CHARM Rower, MD (Referring MD) Medicines:             Propofol  per Anesthesia Complications:         No immediate complications. Procedure:             Pre-Anesthesia Assessment:                        - Prior to the procedure, a History and Physical was                         performed, and patient medications and allergies were                         reviewed. The patient's tolerance of previous                         anesthesia was also reviewed. The risks and benefits                         of the procedure and the sedation options and risks                         were discussed with the patient. All questions were                         answered, and informed consent was obtained. Prior                         Anticoagulants: The patient has taken no anticoagulant                         or antiplatelet agents. ASA Grade Assessment: II - A                         patient with mild systemic disease. After reviewing                         the risks and benefits, the patient was deemed in                         satisfactory condition to undergo the procedure.                        After obtaining informed consent, the endoscope was                         passed under direct vision. Throughout the procedure,  the patient's blood pressure, pulse, and oxygen                         saturations were monitored continuously. The Endoscope                         was introduced through the mouth, and advanced to  the                         second part of duodenum. The upper GI endoscopy was                         accomplished without difficulty. The patient tolerated                         the procedure well. Findings:      A small hiatal hernia was present.      Hematin (altered blood/coffee-ground-like material) was found in the       gastric fundus.      A few localized diminutive erosions with no stigmata of recent bleeding       were found in the gastric body.      The examined duodenum was normal. Impression:            - Small hiatal hernia.                        - Hematin (altered blood/coffee-ground-like material)                         in the gastric fundus.                        - Erosive gastropathy with no stigmata of recent                         bleeding.                        - Normal examined duodenum.                        - No specimens collected. Recommendation:        - Return patient to hospital ward for ongoing care.                        - Resume previous diet.                        - Continue present medications.                        - Perform a colonoscopy today. Procedure Code(s):     --- Professional ---                        [phone], Esophagogastroduodenoscopy, flexible,                         transoral; diagnostic, including collection of  specimen(s) by brushing or washing, when performed                         (separate procedure) Diagnosis Code(s):     --- Professional ---                        K92.1, Melena (includes Hematochezia)                        K31.89, Other diseases of stomach and duodenum CPT copyright 2022 [employer]. All rights reserved. The codes documented in this report are preliminary and upon coder review may  be revised to meet current compliance requirements. Rogelia Copping MD, MD 09/28/2023 1:27:14 PM This report has been signed electronically. Number of Addenda: 0 Note Initiated On:  09/28/2023 12:52 PM Estimated Blood Loss:  Estimated blood loss: none.      [facility]

## 2023-09-29 ENCOUNTER — Inpatient Hospital Stay: Admitting: Certified Registered"

## 2023-09-29 ENCOUNTER — Encounter: Payer: Self-pay | Admitting: Gastroenterology

## 2023-09-29 ENCOUNTER — Inpatient Hospital Stay: Admit: 2023-09-29

## 2023-09-29 ENCOUNTER — Inpatient Hospital Stay
Admit: 2023-09-29 | Discharge: 2023-09-29 | Disposition: A | Discharge status: Home / Self Care | Attending: Gastroenterology | Admitting: Gastroenterology

## 2023-09-29 ENCOUNTER — Encounter
Admission: EM | Disposition: A | Discharge status: Home Health | Payer: Self-pay | Source: Home / Self Care | Attending: Student

## 2023-09-29 DIAGNOSIS — K641 Second degree hemorrhoids: Secondary | ICD-10-CM | POA: Diagnosis not present

## 2023-09-29 DIAGNOSIS — K5730 Diverticulosis of large intestine without perforation or abscess without bleeding: Secondary | ICD-10-CM

## 2023-09-29 DIAGNOSIS — K922 Gastrointestinal hemorrhage, unspecified: Secondary | ICD-10-CM | POA: Diagnosis not present

## 2023-09-29 HISTORY — PX: COLONOSCOPY: SHX5424

## 2023-09-29 LAB — CBC
HCT: 32.3 % — ABNORMAL LOW (ref 36.0–46.0)
Hemoglobin: 10.1 g/dL — ABNORMAL LOW (ref 12.0–15.0)
MCH: 28.5 pg (ref 26.0–34.0)
MCHC: 31.3 g/dL (ref 30.0–36.0)
MCV: 91 fL (ref 80.0–100.0)
Platelets: 226 10*3/uL (ref 150–400)
RBC: 3.55 MIL/uL — ABNORMAL LOW (ref 3.87–5.11)
RDW: 16.7 % — ABNORMAL HIGH (ref 11.5–15.5)
WBC: 10 10*3/uL (ref 4.0–10.5)
nRBC: 0 % (ref 0.0–0.2)

## 2023-09-29 LAB — MAGNESIUM: Magnesium: 2.2 mg/dL (ref 1.7–2.4)

## 2023-09-29 LAB — BASIC METABOLIC PANEL WITH GFR
Anion gap: 7 (ref 5–15)
BUN: 35 mg/dL — ABNORMAL HIGH (ref 8–23)
CO2: 21 mmol/L — ABNORMAL LOW (ref 22–32)
Calcium: 8.8 mg/dL — ABNORMAL LOW (ref 8.9–10.3)
Chloride: 110 mmol/L (ref 98–111)
Creatinine, Ser: 1.02 mg/dL — ABNORMAL HIGH (ref 0.44–1.00)
GFR, Estimated: 53 mL/min — ABNORMAL LOW (ref >60)
Glucose, Bld: 111 mg/dL — ABNORMAL HIGH (ref 70–99)
Potassium: 5 mmol/L (ref 3.5–5.1)
Sodium: 138 mmol/L (ref 135–145)

## 2023-09-29 LAB — PHOSPHORUS: Phosphorus: 3 mg/dL (ref 2.5–4.6)

## 2023-09-29 SURGERY — COLONOSCOPY
Anesthesia: General

## 2023-09-29 MED ORDER — ENOXAPARIN SODIUM 60 MG/0.6ML IJ SOSY
0.5000 mg/kg | PREFILLED_SYRINGE | Freq: Every day | INTRAMUSCULAR | Status: DC
  Filled 2023-09-29: qty 0.6

## 2023-09-29 MED ORDER — ENOXAPARIN SODIUM 40 MG/0.4ML IJ SOSY: 40.0000 mg | PREFILLED_SYRINGE | Freq: Every day | INTRAMUSCULAR | Status: DC

## 2023-09-29 MED ORDER — SODIUM CHLORIDE 0.9 % IV SOLN: INTRAVENOUS | Status: DC

## 2023-09-29 MED ORDER — PROPOFOL 10 MG/ML IV BOLUS
INTRAVENOUS | Status: DC | PRN
  Administered 2023-09-29: 50 mg via INTRAVENOUS
  Administered 2023-09-29: 80 ug/kg/min via INTRAVENOUS

## 2023-09-29 MED ORDER — ALPRAZOLAM 0.5 MG PO TABS
0.5000 mg | ORAL_TABLET | Freq: Three times a day (TID) | ORAL | Status: DC | PRN
  Administered 2023-09-29: 0.5 mg via ORAL
  Filled 2023-09-29: qty 1

## 2023-09-29 MED ORDER — PANTOPRAZOLE SODIUM 40 MG IV SOLR
40.0000 mg | Freq: Two times a day (BID) | INTRAVENOUS | Status: DC
  Administered 2023-09-29 – 2023-09-30 (×3): 40 mg via INTRAVENOUS
  Filled 2023-09-29 (×3): qty 10

## 2023-09-29 MED ORDER — ENOXAPARIN SODIUM 60 MG/0.6ML IJ SOSY
0.5000 mg/kg | PREFILLED_SYRINGE | Freq: Every day | INTRAMUSCULAR | Status: DC
  Administered 2023-09-29 – 2023-09-30 (×2): 50 mg via SUBCUTANEOUS
  Filled 2023-09-29 (×2): qty 0.6

## 2023-09-29 NOTE — Progress Notes (Signed)
 [facility] CLINIC CARDIOLOGY PROGRESS NOTE       Patient ID: Stacy Conway MRN: [id number] DOB/AGE: 1934-10-15 89 y.o.  Admit date: 09/27/2023 Referring Physician Dr. Von Primary Physician Stacy Norleen BIRCH, [name], pause; [name], hypertension, paroxymal atrial fibrillation, pure hypercholesteremia, [name], obesity, CKD stage 3b who presented to the ED on 09/27/2023 for rectal bleeding, [name]  Interval History: -Patient seen and examined this AM and laying comfortably in hospital bed. Patient states she feels fine this AM but feeling nervous about colonoscopy today and states still having diarrhea. Denies SOB, chest pain or palpitations.  -Patients BP and HR stable this AM. Per tele in AF with controlled rates 70-80s. No evidence of bradycardia or pauses overnight or this AM -Patient remains on room air with stable SpO2.  -Patient underwent EGD on 06/24 revealed small hiatal hernia, hematin in gastric fundus, erosive gastropathy.  -Plan for Colonoscopy today.   Pertinent Cardiac History (Most recent) -13 day Holter monitor from 12/7-12/21/2023, which revealed predominant sinus bradycardia with mean heart rate of 51 bpm, sinus heart rate range 21 to 86 bpm. Frequent pauses were observed the longest lasting 5.68 seconds.  There were 3 brief atrial runs the longest lasting 3 beats. There were no patient entries. The patient was taking atenolol  12.5 mg daily during this time. While wearing the Holter monitor, she took atenolol  for part of the time, but then stopped it altogether. The period of time that she took atenolol  is unclear. She states that when she took a quarter tablet, it took about 3 days for her bradycardia to  resolve.   -Repeat 72-hour Holter monitor 04/15/2022 - 04/18/2022, off of atenolol , revealed predominant sinus bradycardia with mean heart rate of 58 bpm, sinus heart rate range 34 to 89 bpm. There were no diary entries.   Review of systems complete and found to be negative unless listed above    Past Medical History:  Diagnosis Date   A-fib [facility])    Arthritis    DNR (do not resuscitate) 09/27/2023   High cholesterol    Hypertension    Obesity, Class III, BMI 40-49.9 (morbid obesity) 09/27/2023   Sleep apnea    Stage 3b chronic kidney disease (HCC)    ckd    Past Surgical History:  Procedure Laterality Date   ABDOMINAL HYSTERECTOMY     BOWEL RESECTION     PARATHYROIDECTOMY  2012   SHOULDER SURGERY     TOTAL HIP ARTHROPLASTY Left 04/22/2017   Procedure: TOTAL HIP ARTHROPLASTY ANTERIOR APPROACH;  Surgeon: Kathlynn Sharper, MD;  Location: [facility] ORS;  Service: Orthopedics;  Laterality: Left;    Medications Prior to Admission  Medication Sig Dispense Refill Last Dose/Taking   amLODipine  (NORVASC ) 10 MG tablet Take 10 mg by mouth every evening.    09/26/2023 Evening   apixaban  (ELIQUIS ) 5 MG TABS tablet Take 5 mg by mouth 2 (two) times daily.   09/26/2023 Evening   furosemide  (LASIX ) 40 MG tablet Take 40 mg by mouth daily as needed for fluid.    Taking As Needed   lisinopril  (PRINIVIL ,ZESTRIL ) 20 MG tablet Take 20 mg by mouth every evening.    09/26/2023 Evening   simvastatin  (ZOCOR ) 20 MG tablet Take 20 mg by mouth every evening.  09/26/2023 Evening   Social History   Socioeconomic History   Marital status: Married    Spouse name: Not on file   Number of children: Not on file   Years of education: Not on file   Highest education level: Not on file  Occupational History   Not on file  Tobacco Use   Smoking status: Former   Smokeless tobacco: Never   Tobacco comments:    50 years ago  Vaping Use   Vaping status: Never Used  Substance and Sexual Activity   Alcohol use: No   Drug  use: No   Sexual activity: Not on file  Other Topics Concern   Not on file  Social History Narrative   Not on file   Social Drivers of Health   Financial Resource Strain: Not on file  Food Insecurity: No Food Insecurity (09/27/2023)   Hunger Vital Sign    Worried About Running Out of Food in the Last Year: Never true    Ran Out of Food in the Last Year: Never true  Transportation Needs: No Transportation Needs (09/27/2023)   PRAPARE - Administrator, Civil Service (Medical): No    Lack of Transportation (Non-Medical): No  Physical Activity: Not on file  Stress: Not on file  Social Connections: Moderately Isolated (09/27/2023)   Social Connection and Isolation Panel    Frequency of Communication with Friends and Family: More than three times a week    Frequency of Social Gatherings with Friends and Family: More than three times a week    Attends Religious Services: Never    Database administrator or Organizations: No    Attends Banker Meetings: Never    Marital Status: Married  Catering manager Violence: Unknown (09/27/2023)   Humiliation, Afraid, Rape, and Kick questionnaire    Fear of Current or Ex-Partner: No    Emotionally Abused: No    Physically Abused: Not on file    Sexually Abused: No    History reviewed. No pertinent family history.   Vitals:   09/28/23 1754 09/28/23 2053 09/29/23 0033 09/29/23 0445  BP: (!) 157/64 (!) 140/84 135/61 (!) 142/127  Pulse: 73 68 (!) 58 78  Resp: 20 18 16 18   Temp: 97.7 F (36.5 C) 99.4 F (37.4 C) 98 F (36.7 C) 98.1 F (36.7 C)  TempSrc: Oral  Oral Oral  SpO2: 97% 95% 95% 93%  Weight:      Height:        PHYSICAL EXAM General: Well appearing female, well nourished, in no acute distress. HEENT: Normocephalic and atraumatic. Neck: No JVD.   Lungs: Normal respiratory effort on room air. Clear bilaterally to auscultation. No wheezes, crackles, rhonchi.  Heart: Irregularly, irregular, slow rates. Normal  S1 and S2, + murmur Abdomen: Non-distended appearing.  Msk: Normal strength and tone for age. Extremities: Warm and well perfused. No clubbing, cyanosis. No edema.  Neuro: Alert and oriented X 3. Psych: Answers questions appropriately.   Labs: Basic Metabolic Panel: Recent Labs    09/28/23 1000 09/29/23 0434  NA 138 138  K 5.1 5.0  CL 109 110  CO2 22 21*  GLUCOSE 107* 111*  BUN 52* 35*  CREATININE 1.42* 1.02*  CALCIUM 8.7* 8.8*  MG 2.5* 2.2  PHOS 3.4 3.0   Liver Function Tests: Recent Labs    09/27/23 0653  AST 23  ALT 19  ALKPHOS 72  BILITOT 0.7  PROT 7.5  ALBUMIN 4.3   No  results for input(s): LIPASE, AMYLASE in the last 72 hours. CBC: Recent Labs    09/28/23 1548 09/29/23 0434  WBC 14.1* 10.0  HGB 10.4* 10.1*  HCT 32.5* 32.3*  MCV 91.0 91.0  PLT 231 226   Cardiac Enzymes: No results for input(s): CKTOTAL, CKMB, CKMBINDEX, TROPONINIHS in the last 72 hours. BNP: No results for input(s): BNP in the last 72 hours. D-Dimer: No results for input(s): DDIMER in the last 72 hours. Hemoglobin A1C: No results for input(s): HGBA1C in the last 72 hours. Fasting Lipid Panel: No results for input(s): CHOL, HDL, LDLCALC, TRIG, CHOLHDL, LDLDIRECT in the last 72 hours. Thyroid  Function Tests: Recent Labs    09/28/23 1000  TSH 1.477   Anemia Panel: Recent Labs    09/28/23 1000  VITAMINB12 689  FOLATE >40.0  TIBC 335  IRON 40     Radiology: US  RENAL Result Date: 09/28/2023 CLINICAL DATA:  [id number] AKI (acute kidney injury) (HCC) [id number] EXAM: RENAL / URINARY TRACT ULTRASOUND COMPLETE COMPARISON:  None Available. FINDINGS: Right Kidney: Renal measurements: 12.7 x 5.1 x 4.7 cm = volume: 161 mL. Normal echogenicity. No mass. No hydronephrosis or nephrolithiasis. Left Kidney: Renal measurements: 10.7 x 5.7 x 4.5 cm = volume: 144 mL. Normal echogenicity. Hypoechoic lesion arising from the lower pole measuring 2.2 cm. No hydronephrosis or  nephrolithiasis. Bladder: Appears normal for degree of bladder distention. Other: None. IMPRESSION: 1. No hydronephrosis or nephrolithiasis. 2. Hypoechoic left lower pole lesion, measuring 2.2 cm. This could represent a proteinaceous or hemorrhagic cyst. Comparison with outside imaging is recommended to document stability. If none is available, a nonemergent multiphase abdominal MRI would be recommended to exclude underlying neoplasm. These results will be called to the ordering clinician or representative by the Radiologist Assistant and communication documented in the PACS or [employer]. Electronically Signed   By: Rogelia Myers M.D.   On: 09/28/2023 18:55   NM GI Blood Loss Result Date: 09/27/2023 CLINICAL DATA:  GI bleeding EXAM: NUCLEAR MEDICINE GASTROINTESTINAL BLEEDING SCAN TECHNIQUE: Sequential abdominal images were obtained following intravenous administration of Tc-61m labeled red blood cells. RADIOPHARMACEUTICALS:  24.4 mCi Tc-70m pertechnetate in-vitro labeled red cells. COMPARISON:  None Available. FINDINGS: There is normal prompt uptake of radiotracer into the vascular system. There is no active gastrointestinal bleeding identified. Imaging was performed over a 2 hour time frame. IMPRESSION: No active gastrointestinal bleeding identified Electronically Signed   By: Greig Pique M.D.   On: 09/27/2023 15:50    ECHO ordered.  03/16/2023 NORMAL LEFT VENTRICULAR SYSTOLIC FUNCTION WITH MILD LVH  ESTIMATED EF: >55%  NORMAL LA PRESSURES WITH NORMAL DIASTOLIC FUNCTION  NORMAL RIGHT VENTRICULAR SYSTOLIC FUNCTION  VALVULAR REGURGITATION: MILD AR, MODERATE MR, TRIVIAL PR, MODERATE TR  NO VALVULAR STENOSIS   TELEMETRY reviewed by me 09/29/2023: Atrial fibrillation, rate 70-80s  (No evidence of bradycardia or pauses overnight or this AM)  EKG reviewed by me: ordered.  Data reviewed by me 09/29/2023: last 24h vitals tele labs imaging I/O hospitalist progress note.  Principal Problem:    Acute lower GI bleeding Active Problems:   A-fib (HCC)   High cholesterol   Hypertension   Sleep apnea   Stage 3b chronic kidney disease (HCC)   Obesity, Class III, BMI 40-49.9 (morbid obesity)   DNR (do not resuscitate)   Acute GI bleeding    ASSESSMENT AND PLAN:  Stacy Conway is a 89 y.o. female  with a past medical history of hx of sinus bradycardia and pauses, hypertension, paroxymal atrial  fibrillation, pure hypercholesteremia, [name], obesity, CKD stage 3b who presented to the ED on 09/27/2023 for rectal bleeding, reports bright red blood. Cardiology was consulted for further evaluation of bradycardia and POC for EGD  # Bright red blood per rectum # Anemia Patient presents with bright red blood per rectum and diarrhea. Hgb stabilizing. Hgb 10.1 this AM. Patient underwent EGD on 06/24 revealed small hiatal hernia, hematin in gastric fundus, erosive gastropathy.  -Plan for Colonoscopy today. -Management per GI.  # Paroxsymal atrial fibrillation # HX Sinus bradycardia  # Hypertension # Hypercholesteremia Prior Echo from 03/2023 with pEF, normal systolic and diastolic function. EKG this admission and tele with atrial fibrillation, rate 60s. (Longest pauses 2.96 seconds on tele on 06/24 at 0100). Per tele no more bradycardia or pauses. As states above, patient has known history of sinus bradycardia and pauses per previous holter monitors. Patient denies cardiac sxs, remains aSX. -Echo ordered. -Monitor and replenish electrolytes for a goal K around 4, Mag around 2  -Home Eliquis  5 mg BID on hold due to acute GI bleed. Resume when cleared by GI and Hgb stable.  -Avoid AVN blockers -Continue amlodipine  10 mg daily.  -Continue simvastatin  20 mg daily.    This patient's plan of care was discussed and created with Dr. Florencio and he is in agreement.  Signed: Dorene Comfort, PA-C  09/29/2023, 6:47 AM [facility]

## 2023-09-29 NOTE — Plan of Care (Signed)

## 2023-09-29 NOTE — Progress Notes (Signed)
 [facility] Progress Note  Patient: Stacy Conway    FMW:[id number]  DOA: 09/27/2023     Date of Service: the patient was seen and examined on 09/29/2023  Chief Complaint  Patient presents with   Rectal Bleeding   Brief hospital course: Stacy Conway is a 89 y.o. female with medical history significant of afib on Eliquis , HLD, HTN, [name], morbid obesity, and stage 3b CKD who presented on 6/23 with rectal bleeding.  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     ER Course:   GI bleeding.  Spoken to Dr. Jinny - suggests tagged RBC scan (no CTA due to AKI on CKD).  On Eliquis  for afib.  Hgb 11.9 -> 9.9.  Temp to 100.7.  Stool sample ordered for GI pathogens.   Assessment and Plan:  # GI bleeding, unknown source Her renal function precludes CTA  Tagged RBC scan negative for acute bleeding. Monitor H&H Started pantoprazole 40 mg IV twice daily Hb 10.1 today GI consulted, s/p EGD, small hiatal hernia, hematin in the gastric fundus, erosive gastropathy with no stigmata of recent bleeding.  Normal duodenum.  Specimen collected. 6/25 s/p colonoscopy, only diverticulosis, no active bleeding.  H&H stable, GI signed off.   # Acute blood loss anemia Baseline Hb 11.9 Hb 10.1 today, stable Iron profile and folate within normal range B12 level 689 within normal range  # Afib On Eliquis , which is likely a contributing factor.  Held Eliquis  for now History of pauses, patient is having pauses on the telemetry Cardiology consulted, patient was admitted for EGD  Continue to monitor on telemetry   # HTN Continue amlodipine  Hold lisinopril  Will add prn hydralazine   # HLD: Continue simvastatin      # AKI on CKD stage 3b Baseline creatinine is 1.47/GFR 35  sCr 1.75--1.42--1.02 6/25 new baseline sCr 1.02, eGFR 53, CKD stage IIIa now Likely due to prerenal failure secondary to dehydration in the setting of GI bleeding -Hold diuretics for now -IVF as above -US -renal -Follow up renal  function by BMP -Avoid ACEI and NSAIDs   # Left renal lesion.  Incidental finding on renal sonogram US  renal: Hypoechoic left lower pole lesion, measuring 2.2 cm. This could represent a proteinaceous or hemorrhagic cyst. Comparison with outside imaging is recommended to document stability. If none is available, a nonemergent multiphase abdominal MRI would be recommended to exclude underlying neoplasm. Patient was advised to follow-up with PCP as an outpatient for further workup.   # [name]: Continue CPAP # Obesity class III, morbid obesity Body mass index is 41.03 kg/m.  Interventions:  Diet: Heart healthy diet DVT Prophylaxis: Lovenox, H&H stable, GI bleeding resolved  Advance goals of care discussion: DNR-limited  Family Communication: family was present at bedside, at the time of interview.  The pt provided permission to discuss medical plan with the family. Opportunity was given to ask question and all questions were answered satisfactorily.   Disposition:  Pt is from Home, admitted with GI bleeding, s/p EGD and colonoscopy is scheduled today.  which precludes a safe discharge. Discharge to home, when stable, most likely tomorrow a.m. if remains stable Need to be seen by PT and OT TOC consulted  Subjective: No significant overnight events, patient had anxiety in the morning, which settled down with Xanax.  Patient is afraid due to GI prep and having a lot of bowel movements.  Patient was explained that this is a part of the process. Patient denied any specific complaints, no active GI bleeding  Patient's daughter was at bedside, management plan discussed.   Physical Exam: General: NAD, lying comfortably Appear in mild distress, affect anxious and depressed Eyes: PERRLA ENT: Oral Mucosa Clear, moist  Neck: no JVD,  Cardiovascular: Irregular rhythm, no Murmur,  Respiratory: good respiratory effort, Bilateral Air entry equal and Decreased, no Crackles, no wheezes Abdomen: Bowel  Sound present, Soft and no tenderness,  Skin: no rashes Extremities: no Pedal edema, no calf tenderness Neurologic: without any new focal findings Gait not checked due to patient safety concerns  Vitals:   09/29/23 0445 09/29/23 1212 09/29/23 1337 09/29/23 1350  BP: (!) 142/127 (!) 156/69 (!) 158/54 (!) 169/80  Pulse: 78 81 79 98  Resp: 18   15  Temp: 98.1 F (36.7 C)   98.5 F (36.9 C)  TempSrc: Oral   Temporal  SpO2: 93% 94%  94%  Weight:    98.5 kg  Height:    5' 1 (1.549 m)    Intake/Output Summary (Last 24 hours) at 09/29/2023 1546 Last data filed at 09/29/2023 0900 Gross per 24 hour  Intake 1380.42 ml  Output 1 ml  Net 1379.42 ml   Filed Weights   09/27/23 0650 09/29/23 1350  Weight: 98.5 kg 98.5 kg    Data Reviewed: I have personally reviewed and interpreted daily labs, tele strips, imagings as discussed above. I reviewed all nursing notes, pharmacy notes, vitals, pertinent old records I have discussed plan of care as described above with RN and patient/family.  CBC: Recent Labs  Lab 09/27/23 2149 09/28/23 0328 09/28/23 1000 09/28/23 1548 09/29/23 0434  WBC 19.8* 15.2* 14.5* 14.1* 10.0  HGB 9.3* 9.2* 9.9* 10.4* 10.1*  HCT 27.8* 29.1* 31.5* 32.5* 32.3*  MCV 88.8 91.8 91.6 91.0 91.0  PLT 219 199 227 231 226   Basic Metabolic Panel: Recent Labs  Lab 09/27/23 0653 09/28/23 0328 09/28/23 1000 09/29/23 0434  NA 137 139 138 138  K 4.6 5.4* 5.1 5.0  CL 108 111 109 110  CO2 20* 22 22 21*  GLUCOSE 161* 92 107* 111*  BUN 64* 58* 52* 35*  CREATININE 1.75* 1.48* 1.42* 1.02*  CALCIUM 9.0 8.2* 8.7* 8.8*  MG  --   --  2.5* 2.2  PHOS  --   --  3.4 3.0    Studies: US  RENAL Result Date: 09/28/2023 CLINICAL DATA:  [id number] AKI (acute kidney injury) (HCC) [id number] EXAM: RENAL / URINARY TRACT ULTRASOUND COMPLETE COMPARISON:  None Available. FINDINGS: Right Kidney: Renal measurements: 12.7 x 5.1 x 4.7 cm = volume: 161 mL. Normal echogenicity. No mass. No  hydronephrosis or nephrolithiasis. Left Kidney: Renal measurements: 10.7 x 5.7 x 4.5 cm = volume: 144 mL. Normal echogenicity. Hypoechoic lesion arising from the lower pole measuring 2.2 cm. No hydronephrosis or nephrolithiasis. Bladder: Appears normal for degree of bladder distention. Other: None. IMPRESSION: 1. No hydronephrosis or nephrolithiasis. 2. Hypoechoic left lower pole lesion, measuring 2.2 cm. This could represent a proteinaceous or hemorrhagic cyst. Comparison with outside imaging is recommended to document stability. If none is available, a nonemergent multiphase abdominal MRI would be recommended to exclude underlying neoplasm. These results will be called to the ordering clinician or representative by the Radiologist Assistant and communication documented in the PACS or [employer]. Electronically Signed   By: Rogelia Myers M.D.   On: 09/28/2023 18:55    Scheduled Meds:  [MAR Hold] amLODipine   10 mg Oral QPM   [MAR Hold] pantoprazole (PROTONIX) IV  40 mg Intravenous Q12H   [  MAR Hold] simvastatin   20 mg Oral QPM   [MAR Hold] sodium chloride  flush  3 mL Intravenous Q12H   Continuous Infusions:  sodium chloride  20 mL/hr at 09/29/23 1357   lactated ringers  100 mL/hr at 09/29/23 0740   PRN Meds: [MAR Hold] acetaminophen  **OR** [MAR Hold] acetaminophen , [MAR Hold] ALPRAZolam, [MAR Hold] hydrALAZINE, [MAR Hold]  morphine  injection, [MAR Hold] ondansetron  **OR** [MAR Hold] ondansetron  (ZOFRAN ) IV  Time spent: 40 minutes  Author: ELVAN SOR. MD [facility] 09/29/2023 3:46 PM  To reach On-call, see care teams to locate the attending and reach out to them via [email or web address]. If 7PM-7AM, please contact night-coverage If you still have difficulty reaching the attending provider, please page the [facility] (Director on Call) for [facility] on amion for assistance.

## 2023-09-29 NOTE — Transfer of Care (Signed)
 Immediate Anesthesia Transfer of Care Note  Patient: Stacy Conway  Procedure(s) Performed: COLONOSCOPY  Patient Location: Endoscopy Unit  Anesthesia Type:General  Level of Consciousness: awake, alert , and oriented  Airway & Oxygen Therapy: Patient Spontanous Breathing and Patient connected to nasal cannula oxygen  Post-op Assessment: Report given to RN and Post -op Vital signs reviewed and stable  Post vital signs: Reviewed and stable  Last Vitals:  Vitals Value Taken Time  BP 140/52 09/29/23 16:47  Temp 36.9 C 09/29/23 16:47  Pulse 88 09/29/23 16:47  Resp 16 09/29/23 16:47  SpO2 94 % 09/29/23 16:47    Last Pain:  Vitals:   09/29/23 1647  TempSrc: Temporal  PainSc:       Patients Stated Pain Goal: 0 (09/29/23 0726)  Complications: No notable events documented.

## 2023-09-29 NOTE — Progress Notes (Signed)
 The patient had a colonoscopy today with only diverticulosis and no sign of any active bleeding or any old blood seen in the intestines.  Her hemoglobin has been stable.  Nothing further to do from a GI point of view at this time.  I will sign off.  Please call if any further GI concerns or questions.  We would like to thank you for the opportunity to participate in the care of Stacy Conway.

## 2023-09-29 NOTE — Op Note (Signed)
 [facility] Gastroenterology Patient Name: Stacy Conway Procedure Date: 09/29/2023 3:45 PM MRN: [id number] Account #: [id number] Date of Birth: [birth year] Admit Type: Inpatient Age: 89 Room: [facility] ENDO ROOM 1 Gender: Female Note Status: Finalized Instrument Name: Arvis [id number] Procedure:             Colonoscopy Indications:           Hematochezia Providers:             Rogelia Copping MD, MD Medicines:             Propofol  per Anesthesia Complications:         No immediate complications. Procedure:             Pre-Anesthesia Assessment:                        - Prior to the procedure, a History and Physical was                         performed, and patient medications and allergies were                         reviewed. The patient's tolerance of previous                         anesthesia was also reviewed. The risks and benefits                         of the procedure and the sedation options and risks                         were discussed with the patient. All questions were                         answered, and informed consent was obtained. Prior                         Anticoagulants: The patient has taken no anticoagulant                         or antiplatelet agents. ASA Grade Assessment: II - A                         patient with mild systemic disease. After reviewing                         the risks and benefits, the patient was deemed in                         satisfactory condition to undergo the procedure.                        After obtaining informed consent, the colonoscope was                         passed under direct vision. Throughout the procedure,                         the patient's blood pressure, pulse, and oxygen  saturations were monitored continuously. The                         Colonoscope was introduced through the anus and                         advanced to the the cecum, identified by  appendiceal                         orifice and ileocecal valve. The colonoscopy was                         performed without difficulty. The patient tolerated                         the procedure well. The quality of the bowel                         preparation was excellent. Findings:      The perianal and digital rectal examinations were normal.      A few small-mouthed diverticula were found in the entire colon.      Non-bleeding internal hemorrhoids were found during retroflexion. The       hemorrhoids were Grade II (internal hemorrhoids that prolapse but reduce       spontaneously). Impression:            - Diverticulosis in the entire examined colon.                        - Non-bleeding internal hemorrhoids.                        - No specimens collected. Recommendation:        - Return patient to hospital ward for ongoing care.                        - Resume previous diet.                        - Continue present medications. Procedure Code(s):     --- Professional ---                        [phone], Colonoscopy, flexible; diagnostic, including                         collection of specimen(s) by brushing or washing, when                         performed (separate procedure) Diagnosis Code(s):     --- Professional ---                        K92.1, Melena (includes Hematochezia) CPT copyright 2022 American [employer] Association. All rights reserved. The codes documented in this report are preliminary and upon coder review may  be revised to meet current compliance requirements. Rogelia Copping MD, MD 09/29/2023 4:43:36 PM This report has been signed electronically. Number of Addenda: 0 Note Initiated On: 09/29/2023 3:45 PM Scope Withdrawal Time: 0 hours 2 minutes 47 seconds  Total Procedure Duration: 0 hours 10 minutes  14 seconds  Estimated Blood Loss:  Estimated blood loss: none.      [facility]

## 2023-09-29 NOTE — Anesthesia Preprocedure Evaluation (Addendum)
 Anesthesia Evaluation  Patient identified by MRN, date of birth, ID band Patient awake    Reviewed: Allergy & Precautions, NPO status , Patient's Chart, lab work & pertinent test results  History of Anesthesia Complications Negative for: history of anesthetic complications  Airway Mallampati: III   Neck ROM: Full    Dental no notable dental hx. (+) Missing, Chipped   Pulmonary sleep apnea , former smoker   Pulmonary exam normal breath sounds clear to auscultation       Cardiovascular hypertension, + dysrhythmias (a fib on Eliquis ) + Valvular Problems/Murmurs MR  Rhythm:Irregular Rate:Normal  ECG 09/28/23: a fib  Echo 03/16/23:  NORMAL LEFT VENTRICULAR SYSTOLIC FUNCTION WITH MILD LVH  ESTIMATED EF: >55%  NORMAL LA PRESSURES WITH NORMAL DIASTOLIC FUNCTION  NORMAL RIGHT VENTRICULAR SYSTOLIC FUNCTION  VALVULAR REGURGITATION: MILD AR, MODERATE MR, TRIVIAL PR, MODERATE TR  NO VALVULAR STENOSIS  IRREGULAR HEART RHYTHM CAPTURED THROUGHOUT EXAM  MILD AI  MODERATE MR  MODERATE TR (3.61m/s)  MODERATE PHTN (RVSP: )  MILD BAE    Neuro/Psych negative neurological ROS     GI/Hepatic negative GI ROS,,,  Endo/Other    Class 3 obesity  Renal/GU Renal disease (stage III CKD)     Musculoskeletal   Abdominal   Peds  Hematology  (+) Blood dyscrasia, anemia   Anesthesia Other Findings Reviewed inpatient cardiology note.   Outpatient cardiology note 09/16/23:  89 y.o. female with  1. Primary hypertension  2. Paroxysmal atrial fibrillation (CMS/HHS-HCC)  3. SOB (shortness of breath)  4. Pure hypercholesterolemia  5. Stage 3a chronic kidney disease (CMS/HHS-HCC)   89 year old female with paroxysmal atrial fibrillation, CHA2DS2-VASc score of 4, on Eliquis  for stroke prevention, with intermittent episodes of brief atrial fibrillation. Patient was seen at [facility] ED 03/09/2022 with low heart rate, ECG revealing sinus bradycardia  54 bpm, at which time atenolol  was discontinued. Two days after discharge, she experienced an episode of heart racing and resumed atenolol  12.5 mg daily. She wore a 13-day Holter monitor which revealed predominant sinus bradycardia with mean heart rate of 51 bpm, sinus heart rate range 21 to 86 bpm. Frequent pauses were observed the longest lasting 5.68 seconds. Frequent premature atrial contractions are rare premature ventricular contractions were present. She took atenolol  for a portion of the time she wore the Holter, but discontinued it at some point. While the patient's Holter monitor is abnormal, and does show significant bradycardia, and pauses, before committing to permanent pacemaker implantation, I strongly recommended repeating the Holter monitor for 72 hours while fully off of atenolol  to confirm the necessity for pacemaker. Repeat Holter monitor showed predominant sinus bradycardia with mean heart rate of 58 bpm, sinus heart rate range 34 to 89 bpm. Frequent premature atrial contractions (10%) and rare premature ventricular contractions were present. She has not been wearing her CPAP for several weeks. She has been noticing palpitations, which could be secondary to atrial fibrillation and/or PACs, which cannot be treated with AV nodal blockers. She does have symptomatic bradycardia.   Plan   1. Continue current medications 2. Continue low-sodium diet 3. DASH diet printed instructions given to the patient 4. Counseled patient about low-cholesterol diet 5. Continue simvastatin  for hyperlipidemia management  6. Low-fat and cholesterol diet printed instructions given to the patient 7. Defer permanent pacemaker implantation at this time per patient's wishes 8. Return to clinic for follow-up in 3 months  No orders of the defined types were placed in this encounter.  Return in about 3 months (around  12/17/2023).    Reproductive/Obstetrics                              Anesthesia Physical Anesthesia Plan  ASA: 3  Anesthesia Plan: General   Post-op Pain Management:    Induction: Intravenous  PONV Risk Score and Plan: 3 and Propofol  infusion, TIVA and Treatment may vary due to age or medical condition  Airway Management Planned: Natural Airway  Additional Equipment:   Intra-op Plan:   Post-operative Plan:   Informed Consent: I have reviewed the patients History and Physical, chart, labs and discussed the procedure including the risks, benefits and alternatives for the proposed anesthesia with the patient or authorized representative who has indicated his/her understanding and acceptance.   Patient has DNR.  Discussed DNR with patient and Suspend DNR.     Plan Discussed with: CRNA  Anesthesia Plan Comments: (Patient's daughter at bedside for history and consent. LMA/GETA backup discussed.  Patient consented for risks of anesthesia including but not limited to:  - adverse reactions to medications - damage to eyes, teeth, lips or other oral mucosa - nerve damage due to positioning  - sore throat or hoarseness - damage to heart, brain, nerves, lungs, other parts of body or loss of life  Informed patient about role of CRNA in peri- and intra-operative care.  Patient voiced understanding.)        Anesthesia Quick Evaluation

## 2023-09-29 NOTE — Anesthesia Procedure Notes (Signed)
 Date/Time: 09/29/2023 4:25 PM  Performed by: Dominica Krabbe, CRNAPre-anesthesia Checklist: Patient identified, Emergency Drugs available, Suction available, Patient being monitored and Timeout performed Patient Re-evaluated:Patient Re-evaluated prior to induction Oxygen Delivery Method: Nasal cannula Preoxygenation: Pre-oxygenation with 100% oxygen Induction Type: IV induction

## 2023-09-30 ENCOUNTER — Encounter: Payer: Self-pay | Admitting: Gastroenterology

## 2023-09-30 ENCOUNTER — Inpatient Hospital Stay

## 2023-09-30 DIAGNOSIS — K922 Gastrointestinal hemorrhage, unspecified: Secondary | ICD-10-CM | POA: Diagnosis not present

## 2023-09-30 LAB — BASIC METABOLIC PANEL WITH GFR
Anion gap: 4 — ABNORMAL LOW (ref 5–15)
BUN: 28 mg/dL — ABNORMAL HIGH (ref 8–23)
CO2: 22 mmol/L (ref 22–32)
Calcium: 8 mg/dL — ABNORMAL LOW (ref 8.9–10.3)
Chloride: 112 mmol/L — ABNORMAL HIGH (ref 98–111)
Creatinine, Ser: 1.07 mg/dL — ABNORMAL HIGH (ref 0.44–1.00)
GFR, Estimated: 50 mL/min — ABNORMAL LOW (ref >60)
Glucose, Bld: 90 mg/dL (ref 70–99)
Potassium: 4.1 mmol/L (ref 3.5–5.1)
Sodium: 138 mmol/L (ref 135–145)

## 2023-09-30 LAB — CBC
HCT: 26.9 % — ABNORMAL LOW (ref 36.0–46.0)
Hemoglobin: 8.5 g/dL — ABNORMAL LOW (ref 12.0–15.0)
MCH: 28.8 pg (ref 26.0–34.0)
MCHC: 31.6 g/dL (ref 30.0–36.0)
MCV: 91.2 fL (ref 80.0–100.0)
Platelets: 178 10*3/uL (ref 150–400)
RBC: 2.95 MIL/uL — ABNORMAL LOW (ref 3.87–5.11)
RDW: 16.9 % — ABNORMAL HIGH (ref 11.5–15.5)
WBC: 7.9 10*3/uL (ref 4.0–10.5)
nRBC: 0 % (ref 0.0–0.2)

## 2023-09-30 LAB — BRAIN NATRIURETIC PEPTIDE: B Natriuretic Peptide: 701.2 pg/mL — ABNORMAL HIGH (ref 0.0–100.0)

## 2023-09-30 LAB — ECHOCARDIOGRAM COMPLETE
Height: 61 in
S' Lateral: 3 cm
Weight: 3474.45 [oz_av]

## 2023-09-30 LAB — MAGNESIUM: Magnesium: 2 mg/dL (ref 1.7–2.4)

## 2023-09-30 LAB — PHOSPHORUS: Phosphorus: 3.6 mg/dL (ref 2.5–4.6)

## 2023-09-30 MED ORDER — FUROSEMIDE 10 MG/ML IJ SOLN
20.0000 mg | Freq: Once | INTRAMUSCULAR | Status: AC
  Administered 2023-09-30: 20 mg via INTRAVENOUS
  Filled 2023-09-30: qty 2

## 2023-09-30 MED ORDER — PANTOPRAZOLE SODIUM 40 MG PO TBEC
40.0000 mg | DELAYED_RELEASE_TABLET | Freq: Two times a day (BID) | ORAL | Status: DC
  Administered 2023-09-30 – 2023-10-03 (×6): 40 mg via ORAL
  Filled 2023-09-30 (×6): qty 1

## 2023-09-30 MED ORDER — AMLODIPINE BESYLATE 10 MG PO TABS
10.0000 mg | ORAL_TABLET | Freq: Every evening | ORAL | Status: DC
  Administered 2023-09-30 – 2023-10-02 (×3): 10 mg via ORAL
  Filled 2023-09-30 (×3): qty 1

## 2023-09-30 MED ORDER — AMLODIPINE BESYLATE 10 MG PO TABS: 10.0000 mg | ORAL_TABLET | Freq: Every evening | ORAL | Status: DC

## 2023-09-30 MED ORDER — WITCH HAZEL-GLYCERIN EX PADS
MEDICATED_PAD | CUTANEOUS | Status: DC | PRN
  Filled 2023-09-30: qty 100

## 2023-09-30 NOTE — Care Management Important Message (Signed)
 Important Message  Patient Details  Name: Stacy Conway MRN: [id number] Date of Birth: [birth year]   Important Message Given:  Yes - Medicare IM     Ernestine Langworthy W, CMA 09/30/2023, 12:10 PM

## 2023-09-30 NOTE — TOC Progression Note (Addendum)
 Transition of Care [facility]) - Progression Note    Patient Details  Name: Stacy Conway MRN: [id number] Date of Birth: Nov 23, 1934  Transition of Care [facility] CM/SW Contact  Dalia GORMAN Fuse, RN Phone Number: 09/30/2023, 1:28 PM  Clinical Narrative:    Therapy is recommending HH PT/OT. The patient chooses [facility]. TOC called referral to Arlina Mort [phone] accepted the referral. [facility] sent referral for DME 3 in 1 BSC to [employer], Mitch accepted the referral. [employer] delivered the equipment to the patient's room and she declined the 3 in 1 bc she has one at home.   Expected Discharge Plan: Home w Home Health Services Barriers to Discharge: Continued Medical Work up  Expected Discharge Plan and Services   Discharge Planning Services: CM Consult   Living arrangements for the past 2 months: Single Family Home                                       Social Determinants of Health (SDOH) Interventions SDOH Screenings   Food Insecurity: No Food Insecurity (09/27/2023)  Housing: Low Risk  (09/27/2023)  Transportation Needs: No Transportation Needs (09/27/2023)  Utilities: Not At Risk (09/27/2023)  Social Connections: Moderately Isolated (09/27/2023)  Tobacco Use: Medium Risk (09/29/2023)    Readmission Risk Interventions     No data to display

## 2023-09-30 NOTE — Evaluation (Addendum)
 Physical Therapy Evaluation Patient Details Name: Stacy Conway MRN: [id number] DOB: Jun 15, 1934 Today's Date: 09/30/2023  History of Present Illness  Pt is an 89 yo female that presented to ED for melena, abdominal pain, workup for GI bleed. PMH of afib on eliquis , HTN, CKD.  Clinical Impression  Patient A&Ox4, reported R chronic hand pain/numbness. At baseline she stated she is modI with her rollator for ambulation, for her ADLs. Caregiver present to assist her husband, and daughter provides dinners most nights/visits most nights as able.   She was sitting EOB eating breakfast on oxygen via Mena, trialed on room air. Noted to be 90% at rest, but did desaturate to mid 80s with mobility. Pt requested to use bathroom as part of mobility. Able to sit <> Stand with supervision-CGA with RW, ambulate ~60ft total, and perform pericare in sitting modI as well. Pt up in recliner with [place] donned, RN/MD notified of pt status.  Overall the patient demonstrated deficits (see PT Problem List) that impede the patient's functional abilities, safety, and mobility and would benefit from skilled PT intervention.          If plan is discharge home, recommend the following: Assistance with cooking/housework;Assist for transportation;Help with stairs or ramp for entrance   Can travel by private vehicle    yes    Equipment Recommendations None recommended by PT  Recommendations for Other Services       Functional Status Assessment Patient has had a recent decline in their functional status and demonstrates the ability to make significant improvements in function in a reasonable and predictable amount of time.     Precautions / Restrictions Precautions Precautions: Fall Restrictions Weight Bearing Restrictions Per Provider Order: No      Mobility  Bed Mobility               General bed mobility comments: seated EOB for breakfast upon PT arrival    Transfers Overall transfer level: Needs  assistance Equipment used: Rolling walker (2 wheels) Transfers: Sit to/from Stand Sit to Stand: Supervision, Contact guard assist           General transfer comment: from EOB, from standard commode CGA    Ambulation/Gait   Gait Distance (Feet): 25 Feet Assistive device: Rolling walker (2 wheels)         General Gait Details: spO2 to mid 80s with mobility, RN notified. pt requested to use bathroom  Stairs            Wheelchair Mobility     Tilt Bed    Modified Rankin (Stroke Patients Only)       Balance                                             Pertinent Vitals/Pain Pain Assessment Pain Assessment: Faces Faces Pain Scale: Hurts a little bit Pain Location: R hand (chronic)    Home Living Family/patient expects to be discharged to:: Private residence Living Arrangements: Spouse/significant other (caregiver for husband, 24/7) Available Help at Discharge: Family Type of Home: House Home Access: Stairs to enter Entrance Stairs-Rails: None Entrance Stairs-Number of Steps: 1   Home Layout: One level Home Equipment: BSC/3in1;Rollator (4 wheels);Rolling Walker (2 wheels)      Prior Function               Mobility Comments: uses rollator ADLs Comments: does her own  breakfast/lunch, daughter provides dinner and comes at night as frequent as able. independent ADLs     Extremity/Trunk Assessment   Upper Extremity Assessment Upper Extremity Assessment: Generalized weakness    Lower Extremity Assessment Lower Extremity Assessment: Generalized weakness    Cervical / Trunk Assessment Cervical / Trunk Assessment: Kyphotic  Communication        Cognition Arousal: Alert Behavior During Therapy: WFL for tasks assessed/performed   PT - Cognitive impairments: No apparent impairments                         Following commands: Intact       Cueing       General Comments      Exercises     Assessment/Plan     PT Assessment Patient needs continued PT services  PT Problem List Decreased activity tolerance;Decreased balance;Decreased mobility;Decreased strength       PT Treatment Interventions DME instruction;Balance training;Gait training;Neuromuscular re-education;Stair training;Functional mobility training;Patient/family education;Therapeutic activities;Therapeutic exercise    PT Goals (Current goals can be found in the Care Plan section)  Acute Rehab PT Goals Patient Stated Goal: to get her strength back PT Goal Formulation: With patient Time For Goal Achievement: 10/14/23 Potential to Achieve Goals: Good    Frequency Min 2X/week     Co-evaluation               AM-PAC PT 6 Clicks Mobility  Outcome Measure Help needed turning from your back to your side while in a flat bed without using bedrails?: None Help needed moving from lying on your back to sitting on the side of a flat bed without using bedrails?: None Help needed moving to and from a bed to a chair (including a wheelchair)?: None Help needed standing up from a chair using your arms (e.g., wheelchair or bedside chair)?: None Help needed to walk in hospital room?: None Help needed climbing 3-5 steps with a railing? : A Little 6 Click Score: 23    End of Session Equipment Utilized During Treatment: Gait belt Activity Tolerance: Patient tolerated treatment well Patient left: in chair;with call bell/phone within reach (RN notified of chair alarm not being on)   PT Visit Diagnosis: Difficulty in walking, not elsewhere classified (R26.2);Other abnormalities of gait and mobility (R26.89);Muscle weakness (generalized) (M62.81)    Time: 9142-9078 PT Time Calculation (min) (ACUTE ONLY): 24 min   Charges:   PT Evaluation $PT Eval Low Complexity: 1 Low PT Treatments $Therapeutic Activity: 8-22 mins PT General Charges $$ ACUTE PT VISIT: 1 Visit         Doyal Shams PT, DPT 9:51 AM,09/30/23

## 2023-09-30 NOTE — Progress Notes (Signed)
 [facility] Progress Note  Patient: Stacy Conway    FMW:[id number]  DOA: 09/27/2023     Date of Service: the patient was seen and examined on 09/30/2023  Chief Complaint  Patient presents with   Rectal Bleeding   Brief hospital course: Stacy Conway is a 89 y.o. female with medical history significant of afib on Eliquis , HLD, HTN, [name], morbid obesity, and stage 3b CKD who presented on 6/23 with rectal bleeding.  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     ER Course:   GI bleeding.  Spoken to Dr. Jinny - suggests tagged RBC scan (no CTA due to AKI on CKD).  On Eliquis  for afib.  Hgb 11.9 -> 9.9.  Temp to 100.7.  Stool sample ordered for GI pathogens.   Assessment and Plan:  # GI bleeding, unknown source Her renal function precludes CTA  Tagged RBC scan negative for acute bleeding. Monitor H&H S/p PPI 40 mg IV BID, on 6/26 switch to oral PPI twice daily  GI consulted, s/p EGD, small hiatal hernia, hematin in the gastric fundus, erosive gastropathy with no stigmata of recent bleeding.  Normal duodenum.  Specimen collected. 6/25 s/p colonoscopy, only diverticulosis, no active bleeding.  H&H stable, GI signed off. Resume Eliquis  tomorrow a.m. 6/27, if no GI bleeding and H&H remained stable  # Acute hypoxic respiratory failure possibly due to volume overload versus atelectasis Continue supplemental O2 halogen and gradually wean off CXR done reports pending BNP 701 slightly elevated 6/26 Lasix  20 mg one-time dose given Monitor renal functions and urine output Continue incentive spirometry   # Acute blood loss anemia Baseline Hb 11.9 Hb 8.5 today, dropped possible hemodilution Iron profile and folate within normal range B12 level 689 within normal range Monitor H&H and transfuse if Hb less than 7  # Afib On Eliquis , which is likely a contributing factor.  Held Eliquis  for now History of pauses, patient is having pauses on the telemetry Cardiology consulted, patient  was admitted for EGD  Continue to monitor on telemetry    # HTN Continue amlodipine  Hold lisinopril  Will add prn hydralazine   # HLD: Continue simvastatin      # AKI on CKD stage 3a Baseline creatinine is 1.47/GFR 35  sCr 1.75--1.42--1.07 6/25 new baseline sCr 1.02, eGFR 53, CKD stage IIIa now Likely due to prerenal failure secondary to dehydration in the setting of GI bleeding -Hold diuretics for now -IVF as above -US -renal -Follow up renal function by BMP -Avoid ACEI and NSAIDs  6/26 Lasix  20 mg IV one-time dose given due to respiratory failure and elevated BNP  # Left renal lesion.  Incidental finding on renal sonogram US  renal: Hypoechoic left lower pole lesion, measuring 2.2 cm. This could represent a proteinaceous or hemorrhagic cyst. Comparison with outside imaging is recommended to document stability. If none is available, a nonemergent multiphase abdominal MRI would be recommended to exclude underlying neoplasm. Patient was advised to follow-up with PCP as an outpatient for further workup.   # [name]: Continue CPAP # Obesity class III, morbid obesity Body mass index is 41.03 kg/m.  Interventions:  Diet: Heart healthy diet DVT Prophylaxis: Lovenox, H&H stable, GI bleeding resolved  Advance goals of care discussion: DNR-limited  Family Communication: family was present at bedside, at the time of interview.  The pt provided permission to discuss medical plan with the family. Opportunity was given to ask question and all questions were answered satisfactorily.   Disposition:  Pt is from Home, admitted with  GI bleeding, s/p EGD and colonoscopy.  Developed respiratory failure and so hemoglobin is low.  Which precludes a safe discharge. Discharge to home with [facility] PT/OT and bedside commode.  Orders placed. TOC is following. Wean oxygen and discharge home in 1 to 2 days if H&H remains stable.  Need to resume Eliquis  DC plan in 1 to 2 days   Subjective: No significant  overnight events, patient was sitting comfortably on the recliner, stated that she is not feeling any shortness of breath but wearing oxygen because her oxygen is level is dropping but no symptoms.  Denied any bleeding.  Physical Exam: General: NAD, lying comfortably Appear in no distress, affect appropriate  Eyes: PERRLA ENT: Oral Mucosa Clear, moist  Neck: no JVD,  Cardiovascular: Irregular rhythm, no Murmur,  Respiratory: good respiratory effort, Bilateral Air entry equal and Decreased, no Crackles, no wheezes Abdomen: Bowel Sound present, Soft and no tenderness,  Skin: no rashes Extremities: no Pedal edema, no calf tenderness Neurologic: without any new focal findings Gait not checked due to patient safety concerns  Vitals:   09/30/23 0009 09/30/23 0357 09/30/23 0845 09/30/23 1154  BP: (!) 155/65 (!) 140/63 (!) 150/51 131/67  Pulse: 93 61 74 77  Resp: 20 18 20 16   Temp: 98.1 F (36.7 C) 98.3 F (36.8 C) 98.6 F (37 C) 98.5 F (36.9 C)  TempSrc:      SpO2: 96% 95% 91% 96%  Weight:      Height:        Intake/Output Summary (Last 24 hours) at 09/30/2023 1340 Last data filed at 09/30/2023 1023 Gross per 24 hour  Intake 745.22 ml  Output 1 ml  Net 744.22 ml   Filed Weights   09/27/23 0650 09/29/23 1350  Weight: 98.5 kg 98.5 kg    Data Reviewed: I have personally reviewed and interpreted daily labs, tele strips, imagings as discussed above. I reviewed all nursing notes, pharmacy notes, vitals, pertinent old records I have discussed plan of care as described above with RN and patient/family.  CBC: Recent Labs  Lab 09/28/23 0328 09/28/23 1000 09/28/23 1548 09/29/23 0434 09/30/23 0335  WBC 15.2* 14.5* 14.1* 10.0 7.9  HGB 9.2* 9.9* 10.4* 10.1* 8.5*  HCT 29.1* 31.5* 32.5* 32.3* 26.9*  MCV 91.8 91.6 91.0 91.0 91.2  PLT 199 227 231 226 178   Basic Metabolic Panel: Recent Labs  Lab 09/27/23 0653 09/28/23 0328 09/28/23 1000 09/29/23 0434 09/30/23 0335  NA 137  139 138 138 138  K 4.6 5.4* 5.1 5.0 4.1  CL 108 111 109 110 112*  CO2 20* 22 22 21* 22  GLUCOSE 161* 92 107* 111* 90  BUN 64* 58* 52* 35* 28*  CREATININE 1.75* 1.48* 1.42* 1.02* 1.07*  CALCIUM 9.0 8.2* 8.7* 8.8* 8.0*  MG  --   --  2.5* 2.2 2.0  PHOS  --   --  3.4 3.0 3.6    Studies: ECHOCARDIOGRAM COMPLETE Result Date: 09/30/2023    ECHOCARDIOGRAM REPORT   Patient Name:   Stacy Conway Date of Exam: 09/29/2023 Medical Rec #:  [id number]        Height:       61.0 in Accession #:    [id number]       Weight:       217.2 lb Date of Birth:  29-Jun-1934         BSA:          1.956 m Patient Age:    85 years  BP:           100/54 mmHg Patient Gender: F                HR:           103 bpm. Exam Location:  [facility] Procedure: 2D Echo, Cardiac Doppler and Color Doppler (Both Spectral and Color            Flow Doppler were utilized during procedure). Indications:     I50.21 Acute Systolic CHF  History:         Patient has no prior history of Echocardiogram examinations.                  Arrythmias:Atrial Fibrillation; Risk Factors:Hypertension and                  Sleep Apnea. Chronic kidney disease.  Sonographer:     Carl Coma RDCS Referring Phys:  [id number] [facility] Diagnosing Phys: Dwayne D Callwood MD IMPRESSIONS  1. Left ventricular ejection fraction, by estimation, is 65 to 70%. The left ventricle has normal function. The left ventricle has no regional wall motion abnormalities. Left ventricular diastolic function could not be evaluated. There is the interventricular septum is flattened in diastole ('D' shaped left ventricle), consistent with right ventricular volume overload.  2. Right ventricular systolic function is normal. The right ventricular size is normal.  3. Left atrial size was mild to moderately dilated.  4. The mitral valve is normal in structure. Mild to moderate mitral valve regurgitation.  5. Tricuspid valve regurgitation is mild to moderate.  6. The aortic valve is normal in  structure. Aortic valve regurgitation is mild. FINDINGS  Left Ventricle: Left ventricular ejection fraction, by estimation, is 65 to 70%. The left ventricle has normal function. The left ventricle has no regional wall motion abnormalities. Strain was performed and the global longitudinal strain is indeterminate. Global longitudinal strain performed but not reported based on interpreter judgement due to suboptimal tracking. The left ventricular internal cavity size was normal in size. There is borderline left ventricular hypertrophy. The interventricular septum is flattened in diastole ('D' shaped left ventricle), consistent with right ventricular volume overload. Left ventricular diastolic function could not be evaluated. Right Ventricle: The right ventricular size is normal. No increase in right ventricular wall thickness. Right ventricular systolic function is normal. Left Atrium: Left atrial size was mild to moderately dilated. Right Atrium: Right atrial size was normal in size. Pericardium: There is no evidence of pericardial effusion. Mitral Valve: The mitral valve is normal in structure. Mild to moderate mitral valve regurgitation. Tricuspid Valve: The tricuspid valve is normal in structure. Tricuspid valve regurgitation is mild to moderate. Aortic Valve: The aortic valve is normal in structure. Aortic valve regurgitation is mild. Pulmonic Valve: The pulmonic valve was normal in structure. Pulmonic valve regurgitation is trivial. Aorta: The ascending aorta was not well visualized. IAS/Shunts: No atrial level shunt detected by color flow Doppler. Additional Comments: 3D was performed not requiring image post processing on an independent workstation and was indeterminate.  LEFT VENTRICLE PLAX 2D LVIDd:         4.70 cm LVIDs:         3.00 cm LV PW:         1.10 cm LV IVS:        1.20 cm LVOT diam:     1.90 cm LV SV:         90 LV SV Index:   46 LVOT  Area:     2.84 cm  RIGHT VENTRICLE RV Basal diam:  5.30 cm RV S  prime:     11.50 cm/s TAPSE (M-mode): 2.3 cm LEFT ATRIUM             Index        RIGHT ATRIUM           Index LA diam:        4.50 cm 2.30 cm/m   RA Area:     18.80 cm LA Vol (A2C):   64.3 ml 32.87 ml/m  RA Volume:   55.30 ml  28.27 ml/m LA Vol (A4C):   50.6 ml 25.87 ml/m LA Biplane Vol: 58.8 ml 30.06 ml/m  AORTIC VALVE LVOT Vmax:   157.50 cm/s LVOT Vmean:  104.100 cm/s LVOT VTI:    0.318 m  AORTA Ao Root diam: 3.30 cm Ao Asc diam:  3.70 cm MV E velocity: 140.00 cm/s  TRICUSPID VALVE                             TR Peak grad:   42.5 mmHg                             TR Vmax:        326.00 cm/s                              SHUNTS                             Systemic VTI:  0.32 m                             Systemic Diam: 1.90 cm Dwayne D Callwood MD Electronically signed by Cara JONETTA Lovelace MD Signature Date/Time: 09/30/2023/7:48:03 AM    Final     Scheduled Meds:  amLODipine   10 mg Oral QPM   enoxaparin (LOVENOX) injection  0.5 mg/kg Subcutaneous Daily   furosemide   20 mg Intravenous Once   pantoprazole (PROTONIX) IV  40 mg Intravenous Q12H   simvastatin   20 mg Oral QPM   sodium chloride  flush  3 mL Intravenous Q12H   Continuous Infusions:  lactated ringers  100 mL/hr at 09/30/23 0822   PRN Meds: acetaminophen  **OR** acetaminophen , ALPRAZolam, hydrALAZINE, morphine  injection, ondansetron  **OR** ondansetron  (ZOFRAN ) IV  Time spent: 40 minutes  Author: ELVAN SOR. MD [facility] 09/30/2023 1:40 PM  To reach On-call, see care teams to locate the attending and reach out to them via [email or web address]. If 7PM-7AM, please contact night-coverage If you still have difficulty reaching the attending provider, please page the [facility] (Director on Call) for [facility] on amion for assistance.

## 2023-09-30 NOTE — Anesthesia Postprocedure Evaluation (Signed)
 Anesthesia Post Note  Patient: Stacy Conway  Procedure(s) Performed: COLONOSCOPY  Patient location during evaluation: PACU Anesthesia Type: General Level of consciousness: awake and alert Pain management: pain level controlled Vital Signs Assessment: post-procedure vital signs reviewed and stable Respiratory status: spontaneous breathing, nonlabored ventilation, respiratory function stable and patient connected to nasal cannula oxygen Cardiovascular status: blood pressure returned to baseline and stable Postop Assessment: no apparent nausea or vomiting Anesthetic complications: no   No notable events documented.   Last Vitals:  Vitals:   09/30/23 0009 09/30/23 0357  BP: (!) 155/65 (!) 140/63  Pulse: 93 61  Resp: 20 18  Temp: 36.7 C 36.8 C  SpO2: 96% 95%    Last Pain:  Vitals:   09/29/23 2138  TempSrc:   PainSc: 0-No pain                 Camellia Merilee Louder

## 2023-09-30 NOTE — Evaluation (Signed)
 Occupational Therapy Evaluation Patient Details Name: Stacy Conway MRN: [id number] DOB: 10-Apr-1934 Today's Date: 09/30/2023   History of Present Illness   Pt is an 89 year old female presented on 6/23 with rectal bleeding;  EGD on 06/24 revealed small hiatal hernia, hematin in gastric fundus, erosive gastropathy, 6/25  colonoscopy only diverticulosis and no sign of any active bleeding or any old blood seen in the intestines    PMH significant for afib on Eliquis , HLD, HTN, [name], morbid obesity, and stage 3b CKD     Clinical Impressions Chart reviewed to date, pt greeted in chair, alert and oriented x4, agreeable to OT evaluation. PTA pt is generally MOD I with ADL, has PRN assist for IADL from daughter, amb with rollator household/short community distances. Pt presents with deficits in strength, endurance, activity tolerance, balance, affecting safe and optimal ADL completion. STS and transfer attempts completed with supervision-CGA with RW. SET UP for feeding/grooming tasks. Education provided re: safe ADL completion with DME/AE due to decreased functional status, importance of progressing mobility. Pt is left in bedside chair, all needs met. OT will continue to follow acutely to facilitate return to PLOF.      If plan is discharge home, recommend the following:   A little help with walking and/or transfers;A little help with bathing/dressing/bathroom;Help with stairs or ramp for entrance;Assistance with cooking/housework     Functional Status Assessment   Patient has had a recent decline in their functional status and demonstrates the ability to make significant improvements in function in a reasonable and predictable amount of time.     Equipment Recommendations   BSC/3in1     Recommendations for Other Services         Precautions/Restrictions   Precautions Precautions: Fall Recall of Precautions/Restrictions: Intact Restrictions Weight Bearing Restrictions Per  Provider Order: No     Mobility Bed Mobility               General bed mobility comments: NT in chair pre/post session    Transfers Overall transfer level: Needs assistance Equipment used: Rolling walker (2 wheels) Transfers: Sit to/from Stand Sit to Stand: Supervision, Contact guard assist                  Balance Overall balance assessment: Needs assistance Sitting-balance support: Feet supported Sitting balance-Leahy Scale: Good     Standing balance support: Bilateral upper extremity supported, During functional activity, Reliant on assistive device for balance Standing balance-Leahy Scale: Fair                             ADL either performed or assessed with clinical judgement   ADL Overall ADL's : Needs assistance/impaired Eating/Feeding: Set up;Sitting   Grooming: Wash/dry face;Sitting;Set up               Lower Body Dressing: Moderate assistance;Sitting/lateral leans Lower Body Dressing Details (indicate cue type and reason): anticipate; pt reports she does not wear socks and goes barefoot at home; has slip in shoes for when shes goes out of the house Toilet Transfer: Supervision/safety;Contact guard assist;Rolling walker (2 wheels) Toilet Transfer Details (indicate cue type and reason): simulated, short amb transfer         Functional mobility during ADLs: Supervision/safety;Contact guard assist;Rolling walker (2 wheels) (short amb transfers in room)       Vision Patient Visual Report: No change from baseline       Perception  Praxis         Pertinent Vitals/Pain Pain Assessment Pain Assessment: Faces Faces Pain Scale: Hurts a little bit Pain Location: R hand Pain Descriptors / Indicators: Aching Pain Intervention(s): Premedicated before session, Limited activity within patient's tolerance, Repositioned     Extremity/Trunk Assessment Upper Extremity Assessment Upper Extremity Assessment: Generalized  weakness   Lower Extremity Assessment Lower Extremity Assessment: Generalized weakness   Cervical / Trunk Assessment Cervical / Trunk Assessment: Kyphotic   Communication Communication Communication: No apparent difficulties Factors Affecting Communication:  (slower speech, she reports she feels she is at baseline)   Cognition Arousal: Alert Behavior During Therapy: WFL for tasks assessed/performed Cognition: No apparent impairments                               Following commands: Intact       Cueing  General Comments   Cueing Techniques: Verbal cues  vss on 3L via [place], spo2 >90% throughout   Exercises Other Exercises Other Exercises: edu re: role of OT, role of rehab, discharge recommendations, outpatient follow up for R Hand pain PRN; safe ADL completion with DME/AE   Shoulder Instructions      Home Living Family/patient expects to be discharged to:: Private residence Living Arrangements: Spouse/significant other (caregiver for husband 24/7) Available Help at Discharge: Family;Personal care attendant;Available 24 hours/day Type of Home: House Home Access: Stairs to enter [employer] of Steps: 1 Entrance Stairs-Rails: None Home Layout: One level     Bathroom Shower/Tub: Producer, television/film/video: Handicapped height Bathroom Accessibility: Yes How Accessible: Accessible via walker Home Equipment: BSC/3in1;Rollator (4 wheels);Rolling Walker (2 wheels)          Prior Functioning/Environment Prior Level of Function : Needs assist             Mobility Comments: amb with rollator ADLs Comments: generally MOD I with ADL- grooming, feeding, dressing, toileting, bathing (has shower chair available if needed but generally stands): PRN assist for IADLs- pt daugther helps with some meal prep, groceries, drives her to appts    OT Problem List: Decreased strength;Impaired balance (sitting and/or standing);Decreased activity  tolerance;Decreased knowledge of use of DME or AE   OT Treatment/Interventions: Self-care/ADL training;DME and/or AE instruction;Therapeutic activities;Balance training;Therapeutic exercise;Energy conservation;Patient/family education      OT Goals(Current goals can be found in the care plan section)   Acute Rehab OT Goals Patient Stated Goal: go home OT Goal Formulation: With patient Time For Goal Achievement: 10/14/23 Potential to Achieve Goals: Good   OT Frequency:  Min 2X/week    Co-evaluation              AM-PAC OT 6 Clicks Daily Activity     Outcome Measure Help from another person eating meals?: None Help from another person taking care of personal grooming?: None Help from another person toileting, which includes using toliet, bedpan, or urinal?: A Little Help from another person bathing (including washing, rinsing, drying)?: A Little Help from another person to put on and taking off regular upper body clothing?: None Help from another person to put on and taking off regular lower body clothing?: A Lot 6 Click Score: 20   End of Session Equipment Utilized During Treatment: Rolling walker (2 wheels);Oxygen Nurse Communication: Mobility status  Activity Tolerance: Patient tolerated treatment well Patient left: in chair;with call bell/phone within reach  OT Visit Diagnosis: Other abnormalities of gait and mobility (R26.89);Muscle weakness (generalized) (  M62.81)                Time: 9067-9054 OT Time Calculation (min): 13 min Charges:  OT General Charges $OT Visit: 1 Visit OT Evaluation $OT Eval Low Complexity: 1 Low  Therisa Sheffield, OTD OTR/L  09/30/23, 10:00 AM

## 2023-09-30 NOTE — Progress Notes (Signed)
 [facility] CLINIC CARDIOLOGY PROGRESS NOTE       Patient ID: Stacy Conway MRN: [id number] DOB/AGE: 1934/10/17 89 y.o.  Admit date: 09/27/2023 Referring Physician Dr. Von Primary Physician Stacy Norleen BIRCH, [name], pause; [name], hypertension, paroxymal atrial fibrillation, pure hypercholesteremia, [name], obesity, CKD stage 3b who presented to the ED on 09/27/2023 for rectal bleeding, [name]  Interval History: -Patient seen and examined this AM and sitting in bedside chair eating breakfast.. Patient states she feels good this AM. Denies chest pain or palpitations.  -Patient states she had a good session with PT.  Patient did have O2 desaturation on room air. -Patients BP elevated and HR stable this AM. Per tele in AF with controlled rates 70-80s.  No significant pauses on telemetry. -Patient remains on 2L with stable SpO2.  -Patient underwent colonoscopy on 6/25 and revealed diverticulosis.   Pertinent Cardiac History (Most recent) -13 day Holter monitor from 12/7-12/21/2023, which revealed predominant sinus bradycardia with mean heart rate of 51 bpm, sinus heart rate range 21 to 86 bpm. Frequent pauses were observed the longest lasting 5.68 seconds.  There were 3 brief atrial runs the longest lasting 3 beats. There were no patient entries. The patient was taking atenolol  12.5 mg daily during this time. While wearing the Holter monitor, she took atenolol  for part of the time, but then stopped it altogether. The period of time that she took atenolol  is unclear. She states that when she took a quarter tablet, it took about 3 days for her bradycardia to resolve.   -Repeat 72-hour Holter monitor 04/15/2022 - 04/18/2022, off of  atenolol , revealed predominant sinus bradycardia with mean heart rate of 58 bpm, sinus heart rate range 34 to 89 bpm. There were no diary entries.   Review of systems complete and found to be negative unless listed above    Past Medical History:  Diagnosis Date   A-fib [facility])    Arthritis    DNR (do not resuscitate) 09/27/2023   High cholesterol    Hypertension    Obesity, Class III, BMI 40-49.9 (morbid obesity) 09/27/2023   Sleep apnea    Stage 3b chronic kidney disease (HCC)    ckd    Past Surgical History:  Procedure Laterality Date   ABDOMINAL HYSTERECTOMY     BOWEL RESECTION     ESOPHAGOGASTRODUODENOSCOPY N/A 09/28/2023   Procedure: EGD (ESOPHAGOGASTRODUODENOSCOPY);  Surgeon: Stacy Carmine, MD;  Location: [facility] ENDOSCOPY;  Service: Endoscopy;  Laterality: N/A;   PARATHYROIDECTOMY  2012   SHOULDER SURGERY     TOTAL HIP ARTHROPLASTY Left 04/22/2017   Procedure: TOTAL HIP ARTHROPLASTY ANTERIOR APPROACH;  Surgeon: Stacy Sharper, MD;  Location: [facility] ORS;  Service: Orthopedics;  Laterality: Left;    Medications Prior to Admission  Medication Sig Dispense Refill Last Dose/Taking   amLODipine  (NORVASC ) 10 MG tablet Take 10 mg by mouth every evening.    09/26/2023 Evening   apixaban  (ELIQUIS ) 5 MG TABS tablet Take 5 mg by mouth 2 (two) times daily.   09/26/2023 Evening   furosemide  (LASIX ) 40 MG tablet Take 40 mg by mouth daily as needed for fluid.    Taking As Needed   lisinopril  (PRINIVIL ,ZESTRIL ) 20 MG tablet Take 20 mg by mouth every evening.  09/26/2023 Evening   simvastatin  (ZOCOR ) 20 MG tablet Take 20 mg by mouth every evening.   09/26/2023 Evening   Social History   Socioeconomic History   Marital status: Married    Spouse name: Not on file   Number of children: Not on file   Years of education: Not on file   Highest education level: Not on file  Occupational History   Not on file  Tobacco Use   Smoking status: Former   Smokeless tobacco: Never   Tobacco comments:    50  years ago  Vaping Use   Vaping status: Never Used  Substance and Sexual Activity   Alcohol use: No   Drug use: No   Sexual activity: Not on file  Other Topics Concern   Not on file  Social History Narrative   Not on file   Social Drivers of Health   Financial Resource Strain: Not on file  Food Insecurity: No Food Insecurity (09/27/2023)   Hunger Vital Sign    Worried About Running Out of Food in the Last Year: Never true    Ran Out of Food in the Last Year: Never true  Transportation Needs: No Transportation Needs (09/27/2023)   PRAPARE - Administrator, Civil Service (Medical): No    Lack of Transportation (Non-Medical): No  Physical Activity: Not on file  Stress: Not on file  Social Connections: Moderately Isolated (09/27/2023)   Social Connection and Isolation Panel    Frequency of Communication with Friends and Family: More than three times a week    Frequency of Social Gatherings with Friends and Family: More than three times a week    Attends Religious Services: Never    Database administrator or Organizations: No    Attends Banker Meetings: Never    Marital Status: Married  Catering manager Violence: Unknown (09/27/2023)   Humiliation, Afraid, Rape, and Kick questionnaire    Fear of Current or Ex-Partner: No    Emotionally Abused: No    Physically Abused: Not on file    Sexually Abused: No    History reviewed. No pertinent family history.   Vitals:   09/29/23 1946 09/30/23 0009 09/30/23 0357 09/30/23 0845  BP: (!) 144/65 (!) 155/65 (!) 140/63 (!) 150/51  Pulse: 84 93 61 74  Resp: 18 20 18 20   Temp: 98.5 F (36.9 C) 98.1 F (36.7 C) 98.3 F (36.8 C) 98.6 F (37 C)  TempSrc:      SpO2: 93% 96% 95% 91%  Weight:      Height:        PHYSICAL EXAM General: Well appearing female, well nourished, in no acute distress. HEENT: Normocephalic and atraumatic. Neck: No JVD.   Lungs: Normal respiratory effort on room air. Clear bilaterally to  auscultation. No wheezes, crackles, rhonchi.  Heart: Irregularly, irregular, slow rates. Normal S1 and S2, + murmur Abdomen: Non-distended appearing.  Msk: Normal strength and tone for age. Extremities: Warm and well perfused. No clubbing, cyanosis. No edema.  Neuro: Alert and oriented X 3. Psych: Answers questions appropriately.   Labs: Basic Metabolic Panel: Recent Labs    09/29/23 0434 09/30/23 0335  NA 138 138  K 5.0 4.1  CL 110 112*  CO2 21* 22  GLUCOSE 111* 90  BUN 35* 28*  CREATININE 1.02* 1.07*  CALCIUM 8.8* 8.0*  MG 2.2 2.0  PHOS 3.0 3.6   Liver Function Tests: No results for input(s): AST, ALT, ALKPHOS, BILITOT, PROT, ALBUMIN  in the last 72 hours.  No results for input(s): LIPASE, AMYLASE in the last 72 hours. CBC: Recent Labs    09/29/23 0434 09/30/23 0335  WBC 10.0 7.9  HGB 10.1* 8.5*  HCT 32.3* 26.9*  MCV 91.0 91.2  PLT 226 178   Cardiac Enzymes: No results for input(s): CKTOTAL, CKMB, CKMBINDEX, TROPONINIHS in the last 72 hours. BNP: No results for input(s): BNP in the last 72 hours. D-Dimer: No results for input(s): DDIMER in the last 72 hours. Hemoglobin A1C: No results for input(s): HGBA1C in the last 72 hours. Fasting Lipid Panel: No results for input(s): CHOL, HDL, LDLCALC, TRIG, CHOLHDL, LDLDIRECT in the last 72 hours. Thyroid  Function Tests: Recent Labs    09/28/23 1000  TSH 1.477   Anemia Panel: Recent Labs    09/28/23 1000  VITAMINB12 689  FOLATE >40.0  TIBC 335  IRON 40     Radiology: ECHOCARDIOGRAM COMPLETE Result Date: 09/30/2023    ECHOCARDIOGRAM REPORT   Patient Name:   HADLEA FURUYA Date of Exam: 09/29/2023 Medical Rec #:  [id number]        Height:       61.0 in Accession #:    [id number]       Weight:       217.2 lb Date of Birth:  05-Apr-1935         BSA:          1.956 m Patient Age:    89 years         BP:           100/54 mmHg Patient Gender: F                HR:           103  bpm. Exam Location:  [facility] Procedure: 2D Echo, Cardiac Doppler and Color Doppler (Both Spectral and Color            Flow Doppler were utilized during procedure). Indications:     I50.21 Acute Systolic CHF  History:         Patient has no prior history of Echocardiogram examinations.                  Arrythmias:Atrial Fibrillation; Risk Factors:Hypertension and                  Sleep Apnea. Chronic kidney disease.  Sonographer:     Carl Coma RDCS Referring Phys:  [id number] [facility] Diagnosing Phys: Dwayne D Callwood MD IMPRESSIONS  1. Left ventricular ejection fraction, by estimation, is 65 to 70%. The left ventricle has normal function. The left ventricle has no regional wall motion abnormalities. Left ventricular diastolic function could not be evaluated. There is the interventricular septum is flattened in diastole ('D' shaped left ventricle), consistent with right ventricular volume overload.  2. Right ventricular systolic function is normal. The right ventricular size is normal.  3. Left atrial size was mild to moderately dilated.  4. The mitral valve is normal in structure. Mild to moderate mitral valve regurgitation.  5. Tricuspid valve regurgitation is mild to moderate.  6. The aortic valve is normal in structure. Aortic valve regurgitation is mild. FINDINGS  Left Ventricle: Left ventricular ejection fraction, by estimation, is 65 to 70%. The left ventricle has normal function. The left ventricle has no regional wall motion abnormalities. Strain was performed and the global longitudinal strain is indeterminate. Global longitudinal strain performed but not reported based on interpreter judgement due to  suboptimal tracking. The left ventricular internal cavity size was normal in size. There is borderline left ventricular hypertrophy. The interventricular septum is flattened in diastole ('D' shaped left ventricle), consistent with right ventricular volume overload. Left ventricular diastolic  function could not be evaluated. Right Ventricle: The right ventricular size is normal. No increase in right ventricular wall thickness. Right ventricular systolic function is normal. Left Atrium: Left atrial size was mild to moderately dilated. Right Atrium: Right atrial size was normal in size. Pericardium: There is no evidence of pericardial effusion. Mitral Valve: The mitral valve is normal in structure. Mild to moderate mitral valve regurgitation. Tricuspid Valve: The tricuspid valve is normal in structure. Tricuspid valve regurgitation is mild to moderate. Aortic Valve: The aortic valve is normal in structure. Aortic valve regurgitation is mild. Pulmonic Valve: The pulmonic valve was normal in structure. Pulmonic valve regurgitation is trivial. Aorta: The ascending aorta was not well visualized. IAS/Shunts: No atrial level shunt detected by color flow Doppler. Additional Comments: 3D was performed not requiring image post processing on an independent workstation and was indeterminate.  LEFT VENTRICLE PLAX 2D LVIDd:         4.70 cm LVIDs:         3.00 cm LV PW:         1.10 cm LV IVS:        1.20 cm LVOT diam:     1.90 cm LV SV:         90 LV SV Index:   46 LVOT Area:     2.84 cm  RIGHT VENTRICLE RV Basal diam:  5.30 cm RV S prime:     11.50 cm/s TAPSE (M-mode): 2.3 cm LEFT ATRIUM             Index        RIGHT ATRIUM           Index LA diam:        4.50 cm 2.30 cm/m   RA Area:     18.80 cm LA Vol (A2C):   64.3 ml 32.87 ml/m  RA Volume:   55.30 ml  28.27 ml/m LA Vol (A4C):   50.6 ml 25.87 ml/m LA Biplane Vol: 58.8 ml 30.06 ml/m  AORTIC VALVE LVOT Vmax:   157.50 cm/s LVOT Vmean:  104.100 cm/s LVOT VTI:    0.318 m  AORTA Ao Root diam: 3.30 cm Ao Asc diam:  3.70 cm MV E velocity: 140.00 cm/s  TRICUSPID VALVE                             TR Peak grad:   42.5 mmHg                             TR Vmax:        326.00 cm/s                              SHUNTS                             Systemic VTI:  0.32 m                              Systemic Diam: 1.90 cm Cara JONETTA Lovelace MD  Electronically signed by Cara JONETTA Lovelace MD Signature Date/Time: 09/30/2023/7:48:03 AM    Final    US  RENAL Result Date: 09/28/2023 CLINICAL DATA:  [id number] AKI (acute kidney injury) (HCC) [id number] EXAM: RENAL / URINARY TRACT ULTRASOUND COMPLETE COMPARISON:  None Available. FINDINGS: Right Kidney: Renal measurements: 12.7 x 5.1 x 4.7 cm = volume: 161 mL. Normal echogenicity. No mass. No hydronephrosis or nephrolithiasis. Left Kidney: Renal measurements: 10.7 x 5.7 x 4.5 cm = volume: 144 mL. Normal echogenicity. Hypoechoic lesion arising from the lower pole measuring 2.2 cm. No hydronephrosis or nephrolithiasis. Bladder: Appears normal for degree of bladder distention. Other: None. IMPRESSION: 1. No hydronephrosis or nephrolithiasis. 2. Hypoechoic left lower pole lesion, measuring 2.2 cm. This could represent a proteinaceous or hemorrhagic cyst. Comparison with outside imaging is recommended to document stability. If none is available, a nonemergent multiphase abdominal MRI would be recommended to exclude underlying neoplasm. These results will be called to the ordering clinician or representative by the Radiologist Assistant and communication documented in the PACS or [employer]. Electronically Signed   By: Rogelia Myers M.D.   On: 09/28/2023 18:55   NM GI Blood Loss Result Date: 09/27/2023 CLINICAL DATA:  GI bleeding EXAM: NUCLEAR MEDICINE GASTROINTESTINAL BLEEDING SCAN TECHNIQUE: Sequential abdominal images were obtained following intravenous administration of Tc-96m labeled red blood cells. RADIOPHARMACEUTICALS:  24.4 mCi Tc-58m pertechnetate in-vitro labeled red cells. COMPARISON:  None Available. FINDINGS: There is normal prompt uptake of radiotracer into the vascular system. There is no active gastrointestinal bleeding identified. Imaging was performed over a 2 hour time frame. IMPRESSION: No active gastrointestinal bleeding identified  Electronically Signed   By: Greig Pique M.D.   On: 09/27/2023 15:50    ECHO as above.  03/16/2023 NORMAL LEFT VENTRICULAR SYSTOLIC FUNCTION WITH MILD LVH  ESTIMATED EF: >55%  NORMAL LA PRESSURES WITH NORMAL DIASTOLIC FUNCTION  NORMAL RIGHT VENTRICULAR SYSTOLIC FUNCTION  VALVULAR REGURGITATION: MILD AR, MODERATE MR, TRIVIAL PR, MODERATE TR  NO VALVULAR STENOSIS   TELEMETRY reviewed by me 09/30/2023: Atrial fibrillation, rate 70-80s  (No evidence of significant pauses overnight or this AM)  EKG reviewed by me: Atrial fibrillation, rate 60 bpm  Data reviewed by me 09/30/2023: last 24h vitals tele labs imaging I/O hospitalist progress note.  Principal Problem:   Acute lower GI bleeding Active Problems:   A-fib (HCC)   High cholesterol   Hypertension   Sleep apnea   Stage 3b chronic kidney disease (HCC)   Obesity, Class III, BMI 40-49.9 (morbid obesity)   DNR (do not resuscitate)   Acute GI bleeding    ASSESSMENT AND PLAN:  Stacy Conway is a 89 y.o. female  with a past medical history of hx of sinus bradycardia and pauses, hypertension, paroxymal atrial fibrillation, pure hypercholesteremia, [name], obesity, CKD stage 3b who presented to the ED on 09/27/2023 for rectal bleeding, reports bright red blood. Cardiology was consulted for further evaluation of bradycardia and POC for EGD  # Bright red blood per rectum # Anemia Patient presents with bright red blood per rectum and diarrhea. Hgb stabilizing. Hgb 10.1 this AM. Patient underwent EGD on 06/24 revealed small hiatal hernia, hematin in gastric fundus, erosive gastropathy.  Patient underwent colonoscopy on 06/25 that revealed diverticulosis. -GI signed off.  # Paroxsymal atrial fibrillation # HX Sinus bradycardia  # Hypertension # Hypercholesteremia Prior Echo from 03/2023 with pEF, normal systolic and diastolic function. EKG this admission and tele with atrial fibrillation, rate 60s. (Longest pauses 2.96 seconds on tele on  06/24 at 0100). Per tele no more bradycardia or pauses. As states above, patient has known history of sinus bradycardia and pauses per previous holter monitors. Patient denies cardiac sxs, remains aSX.  Echo this admission with pEF, no regional wall motion abnormality, mild to mod TR. -Monitor and replenish electrolytes for a goal K around 4, Mag around 2  -Home Eliquis  5 mg BID on hold due to acute GI bleed. Resume when cleared by GI and Hgb stable.  -Avoid AVN blockers -Continue amlodipine  10 mg daily.  -Continue simvastatin  20 mg daily.    This patient's plan of care was discussed and created with Dr. Florencio and he is in agreement.  Signed: Dorene Comfort, PA-C  09/30/2023, 10:37 AM [facility]

## 2023-10-01 DIAGNOSIS — K922 Gastrointestinal hemorrhage, unspecified: Secondary | ICD-10-CM | POA: Diagnosis not present

## 2023-10-01 LAB — CBC
HCT: 28.4 % — ABNORMAL LOW (ref 36.0–46.0)
Hemoglobin: 9 g/dL — ABNORMAL LOW (ref 12.0–15.0)
MCH: 28.8 pg (ref 26.0–34.0)
MCHC: 31.7 g/dL (ref 30.0–36.0)
MCV: 91 fL (ref 80.0–100.0)
Platelets: 195 10*3/uL (ref 150–400)
RBC: 3.12 MIL/uL — ABNORMAL LOW (ref 3.87–5.11)
RDW: 16.8 % — ABNORMAL HIGH (ref 11.5–15.5)
WBC: 7.8 10*3/uL (ref 4.0–10.5)
nRBC: 0 % (ref 0.0–0.2)

## 2023-10-01 LAB — BASIC METABOLIC PANEL WITH GFR
Anion gap: 10 (ref 5–15)
BUN: 31 mg/dL — ABNORMAL HIGH (ref 8–23)
CO2: 21 mmol/L — ABNORMAL LOW (ref 22–32)
Calcium: 7.8 mg/dL — ABNORMAL LOW (ref 8.9–10.3)
Chloride: 106 mmol/L (ref 98–111)
Creatinine, Ser: 1.23 mg/dL — ABNORMAL HIGH (ref 0.44–1.00)
GFR, Estimated: 43 mL/min — ABNORMAL LOW (ref >60)
Glucose, Bld: 98 mg/dL (ref 70–99)
Potassium: 4.4 mmol/L (ref 3.5–5.1)
Sodium: 137 mmol/L (ref 135–145)

## 2023-10-01 LAB — BRAIN NATRIURETIC PEPTIDE: B Natriuretic Peptide: 455.7 pg/mL — ABNORMAL HIGH (ref 0.0–100.0)

## 2023-10-01 LAB — GLUCOSE, CAPILLARY: Glucose-Capillary: 113 mg/dL — ABNORMAL HIGH (ref 70–99)

## 2023-10-01 MED ORDER — LISINOPRIL 20 MG PO TABS
20.0000 mg | ORAL_TABLET | Freq: Every evening | ORAL | Status: DC
  Administered 2023-10-01 – 2023-10-02 (×2): 20 mg via ORAL
  Filled 2023-10-01 (×2): qty 1

## 2023-10-01 MED ORDER — APIXABAN 5 MG PO TABS
5.0000 mg | ORAL_TABLET | Freq: Two times a day (BID) | ORAL | Status: DC
  Administered 2023-10-01 – 2023-10-03 (×5): 5 mg via ORAL
  Filled 2023-10-01 (×5): qty 1

## 2023-10-01 NOTE — Progress Notes (Signed)
 [facility] Progress Note  Patient: Stacy Conway    FMW:[id number]  DOA: 09/27/2023     Date of Service: the patient was seen and examined on 10/01/2023  Chief Complaint  Patient presents with   Rectal Bleeding   Brief hospital course: Stacy Conway is a 89 y.o. female with medical history significant of afib on Eliquis , HLD, HTN, [name], morbid obesity, and stage 3b CKD who presented on 6/23 with rectal bleeding.  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  underwent tagged RBC scan (no CTA due to AKI on CKD) which was negative.  On Eliquis  for afib which was held on admission.   ED course: Hgb 11.9 -> 9.9.  Temp to 100.7.  Stool sample ordered for GI pathogens. GI consulted and performed EGD 6/24 followed by colonoscopy 6/25. Findings included hiatal hernia, diverticulosis with old blood in intestines but no acute bleeding found.   Assessment and Plan: # GI bleeding, unknown source  Acute blood loss anemia-Tagged RBC scan negative for acute bleeding. Appears to have stopped spontaneously has hgb now stable and no further signs of bleeding in stool.  - restarting eliquis  - continue PPI - hgb in am since restarting anticoagulation and if stable can dc home, - iron supplement for hematopoiesis   # Acute hypoxic respiratory failure- currently on room air to 1L while resting - with ambulation today desat to 88% ORA and recovers >90% when back to resting. Suspect related to acute anemia - will ambulate with pulse ox again in the morning and if >90% will not need to go home with oxygen  Continue supplemental O2 halogen and gradually wean off CXR done reports pending BNP 701 slightly elevated 6/26 Lasix  20 mg one-time dose given Monitor renal functions and urine output Continue incentive spirometry  # Afib On Eliquis , which is likely a contributing factor Cardiology consulted. Patient is rate controlled. Eliquis  is restarted today. They have signed off and will follow up  outpatient.   # HTN Continue amlodipine  restart lisinopril - would not be unexpected to have mild increase in Cr after restarting but would expect to be transient and overall have renal-protective factors so recheck BMP on outpatient follow up    # HLD: Continue simvastatin     # AKI on CKD stage 3a- resolved AKI. Currently at baseline  # Left renal lesion.  Incidental finding on renal sonogram US  renal: Hypoechoic left lower pole lesion, measuring 2.2 cm. This could represent a proteinaceous or hemorrhagic cyst. Comparison with outside imaging is recommended to document stability. If none is available, a nonemergent multiphase abdominal MRI would be recommended to exclude underlying neoplasm. Patient was advised to follow-up with PCP as an outpatient for further workup.  # [name]: Continue CPAP # Obesity class III, morbid obesity Body mass index is 41.03 kg/m.  Interventions:  Diet: Heart healthy diet DVT Prophylaxis: Lovenox , H&H stable, GI bleeding resolved  Advance goals of care discussion: DNR-limited  Family Communication: none at bedside  Disposition: plan to dc tomorrow if hgb and O2 status stable  Subjective: encounter just following patient's ambulation trial so she endorses shortness of breath and feeling tired out from the exertion. Denies chest pain. No bleeding in BM. Tolerating diet. No palpitations. Feels that she needs another day with us  since for recovery.  Physical Exam: General: NAD, lying comfortably Appear in no distress, affect appropriate  ENT: Oral Mucosa Clear, moist  Neck: no JVD,  Cardiovascular: Irregular rhythm, no Murmur,  Respiratory: good respiratory effort, no Crackles, no  wheezes Abdomen: Bowel Sound present, Soft and no tenderness,  Skin: no rashes Extremities: no Pedal edema, no calf tenderness Neurologic: without any new focal findings  Vitals:   09/30/23 1629 09/30/23 2032 09/30/23 2314 10/01/23 0443  BP: (!) 148/58 (!) 155/63 (!)  147/69 (!) 150/76  Pulse: 69 84 69 69  Resp:  18 18 20   Temp: 99.1 F (37.3 C) 98.8 F (37.1 C) 98.4 F (36.9 C) 97.8 F (36.6 C)  TempSrc:      SpO2: 93% 93% 97% 96%  Weight:      Height:        Intake/Output Summary (Last 24 hours) at 10/01/2023 0719 Last data filed at 09/30/2023 1424 Gross per 24 hour  Intake 240 ml  Output --  Net 240 ml   Filed Weights   09/27/23 0650 09/29/23 1350  Weight: 98.5 kg 98.5 kg   CBC: Recent Labs  Lab 09/28/23 1000 09/28/23 1548 09/29/23 0434 09/30/23 0335 10/01/23 0507  WBC 14.5* 14.1* 10.0 7.9 7.8  HGB 9.9* 10.4* 10.1* 8.5* 9.0*  HCT 31.5* 32.5* 32.3* 26.9* 28.4*  MCV 91.6 91.0 91.0 91.2 91.0  PLT 227 231 226 178 195   Basic Metabolic Panel: Recent Labs  Lab 09/28/23 0328 09/28/23 1000 09/29/23 0434 09/30/23 0335 10/01/23 0507  NA 139 138 138 138 137  K 5.4* 5.1 5.0 4.1 4.4  CL 111 109 110 112* 106  CO2 22 22 21* 22 21*  GLUCOSE 92 107* 111* 90 98  BUN 58* 52* 35* 28* 31*  CREATININE 1.48* 1.42* 1.02* 1.07* 1.23*  CALCIUM 8.2* 8.7* 8.8* 8.0* 7.8*  MG  --  2.5* 2.2 2.0  --   PHOS  --  3.4 3.0 3.6  --    Time spent: 40 minutes  Author: Marien LITTIE Piety, MD  [facility] 10/01/2023 7:19 AM  To reach On-call, see care teams to locate the attending and reach out to them via [email or web address]. If 7PM-7AM, please contact night-coverage If you still have difficulty reaching the attending provider, please page the [facility] (Director on Call) for [facility] on amion for assistance.

## 2023-10-01 NOTE — Progress Notes (Signed)
 [facility] CARDIOLOGY PROGRESS NOTE       Patient ID: Stacy Conway MRN: [id number] DOB/AGE: 09/17/1934 89 y.o.  Admit date: 09/27/2023 Referring Physician Dr. Von Primary Physician Stacy Conway BIRCH, [name], pause; [name], hypertension, paroxymal atrial fibrillation, pure hypercholesteremia, [name], obesity, CKD stage 3b who presented to the ED on 09/27/2023 for rectal bleeding, [name]  Interval History:  -Patient seen and examined this AM and laying comfortably in hospital bed in no acute distress. Patient states she feels good this AM. Denies chest pain or palpitations.  -Reports no more episodes of blood in stool. -Patient states she had a better session with PT today, however reportedly did desat with walking.   -Patients BP elevated and HR stable this AM. Off tele. -Patient remains on room air with stable SpO2.    Pertinent Cardiac History (Most recent) -13 day Holter monitor from 12/7-12/21/2023, which revealed predominant sinus bradycardia with mean heart rate of 51 bpm, sinus heart rate range 21 to 86 bpm. Frequent pauses were observed the longest lasting 5.68 seconds.  There were 3 brief atrial runs the longest lasting 3 beats. There were no patient entries. The patient was taking atenolol  12.5 mg daily during this time. While wearing the Holter monitor, she took atenolol  for part of the time, but then stopped it altogether. The period of time that she took atenolol  is unclear. She states that when she took a quarter tablet, it took about 3 days for her bradycardia to resolve.   -Repeat 72-hour Holter monitor 04/15/2022 - 04/18/2022, off of atenolol , revealed predominant sinus bradycardia with mean heart rate  of 58 bpm, sinus heart rate range 34 to 89 bpm. There were no diary entries.   Review of systems complete and found to be negative unless listed above    Past Medical History:  Diagnosis Date   A-fib [facility])    Arthritis    DNR (do not resuscitate) 09/27/2023   High cholesterol    Hypertension    Obesity, Class III, BMI 40-49.9 (morbid obesity) 09/27/2023   Sleep apnea    Stage 3b chronic kidney disease (HCC)    ckd    Past Surgical History:  Procedure Laterality Date   ABDOMINAL HYSTERECTOMY     BOWEL RESECTION     COLONOSCOPY N/A 09/29/2023   Procedure: COLONOSCOPY;  Surgeon: Jinny Carmine, MD;  Location: [facility] ENDOSCOPY;  Service: Endoscopy;  Laterality: N/A;   ESOPHAGOGASTRODUODENOSCOPY N/A 09/28/2023   Procedure: EGD (ESOPHAGOGASTRODUODENOSCOPY);  Surgeon: Jinny Carmine, MD;  Location[facility] ENDOSCOPY;  Service: Endoscopy;  Laterality: N/A;   PARATHYROIDECTOMY  2012   SHOULDER SURGERY     TOTAL HIP ARTHROPLASTY Left 04/22/2017   Procedure: TOTAL HIP ARTHROPLASTY ANTERIOR APPROACH;  Surgeon: Kathlynn Sharper, MD;  Location: [facility] ORS;  Service: Orthopedics;  Laterality: Left;    Medications Prior to Admission  Medication Sig Dispense Refill Last Dose/Taking   amLODipine  (NORVASC ) 10 MG tablet Take 10 mg by mouth every evening.    09/26/2023 Evening   apixaban  (ELIQUIS ) 5 MG TABS tablet Take 5 mg by mouth 2 (two) times daily.   09/26/2023 Evening   furosemide  (LASIX ) 40 MG tablet Take 40 mg by mouth daily as needed for fluid.    Taking As Needed  lisinopril  (PRINIVIL ,ZESTRIL ) 20 MG tablet Take 20 mg by mouth every evening.    09/26/2023 Evening   simvastatin  (ZOCOR ) 20 MG tablet Take 20 mg by mouth every evening.   09/26/2023 Evening   Social History   Socioeconomic History   Marital status: Married    Spouse name: Not on file   Number of children: Not on file   Years of education: Not on file   Highest education level: Not on file  Occupational History   Not on file  Tobacco Use    Smoking status: Former   Smokeless tobacco: Never   Tobacco comments:    50 years ago  Vaping Use   Vaping status: Never Used  Substance and Sexual Activity   Alcohol use: No   Drug use: No   Sexual activity: Not on file  Other Topics Concern   Not on file  Social History Narrative   Not on file   Social Drivers of Health   Financial Resource Strain: Not on file  Food Insecurity: No Food Insecurity (09/27/2023)   Hunger Vital Sign    Worried About Running Out of Food in the Last Year: Never true    Ran Out of Food in the Last Year: Never true  Transportation Needs: No Transportation Needs (09/27/2023)   PRAPARE - Administrator, Civil Service (Medical): No    Lack of Transportation (Non-Medical): No  Physical Activity: Not on file  Stress: Not on file  Social Connections: Moderately Isolated (09/27/2023)   Social Connection and Isolation Panel    Frequency of Communication with Friends and Family: More than three times a week    Frequency of Social Gatherings with Friends and Family: More than three times a week    Attends Religious Services: Never    Database administrator or Organizations: No    Attends Banker Meetings: Never    Marital Status: Married  Catering manager Violence: Unknown (09/27/2023)   Humiliation, Afraid, Rape, and Kick questionnaire    Fear of Current or Ex-Partner: No    Emotionally Abused: No    Physically Abused: Not on file    Sexually Abused: No    History reviewed. No pertinent family history.   Vitals:   09/30/23 2032 09/30/23 2314 10/01/23 0443 10/01/23 0759  BP: (!) 155/63 (!) 147/69 (!) 150/76 (!) 122/57  Pulse: 84 69 69 62  Resp: 18 18 20 16   Temp: 98.8 F (37.1 C) 98.4 F (36.9 C) 97.8 F (36.6 C) 98.2 F (36.8 C)  TempSrc:      SpO2: 93% 97% 96% 90%  Weight:      Height:        PHYSICAL EXAM General: Well appearing female, well nourished, in no acute distress. HEENT: Normocephalic and  atraumatic. Neck: No JVD.   Lungs: Normal respiratory effort on room air. Clear bilaterally to auscultation. No wheezes, crackles, rhonchi.  Heart: Irregularly, irregular, slow rates. Normal S1 and S2, + murmur Abdomen: Non-distended appearing.  Msk: Normal strength and tone for age. Extremities: Warm and well perfused. No clubbing, cyanosis. No edema.  Neuro: Alert and oriented X 3. Psych: Answers questions appropriately.   Labs: Basic Metabolic Panel: Recent Labs    09/29/23 0434 09/30/23 0335 10/01/23 0507  NA 138 138 137  K 5.0 4.1 4.4  CL 110 112* 106  CO2 21* 22 21*  GLUCOSE 111* 90 98  BUN 35* 28* 31*  CREATININE 1.02* 1.07* 1.23*  CALCIUM 8.8*  8.0* 7.8*  MG 2.2 2.0  --   PHOS 3.0 3.6  --    Liver Function Tests: No results for input(s): AST, ALT, ALKPHOS, BILITOT, PROT, ALBUMIN in the last 72 hours.  No results for input(s): LIPASE, AMYLASE in the last 72 hours. CBC: Recent Labs    09/30/23 0335 10/01/23 0507  WBC 7.9 7.8  HGB 8.5* 9.0*  HCT 26.9* 28.4*  MCV 91.2 91.0  PLT 178 195   Cardiac Enzymes: No results for input(s): CKTOTAL, CKMB, CKMBINDEX, TROPONINIHS in the last 72 hours. BNP: Recent Labs    09/30/23 0335 10/01/23 0507  BNP 701.2* 455.7*   D-Dimer: No results for input(s): DDIMER in the last 72 hours. Hemoglobin A1C: No results for input(s): HGBA1C in the last 72 hours. Fasting Lipid Panel: No results for input(s): CHOL, HDL, LDLCALC, TRIG, CHOLHDL, LDLDIRECT in the last 72 hours. Thyroid  Function Tests: Recent Labs    09/28/23 1000  TSH 1.477   Anemia Panel: Recent Labs    09/28/23 1000  VITAMINB12 689  FOLATE >40.0  TIBC 335  IRON 40     Radiology: [facility] Chest Port 1 View Result Date: 09/30/2023 CLINICAL DATA:  Shortness of breath EXAM: PORTABLE CHEST 1 VIEW COMPARISON:  03/09/2022 FINDINGS: The Chin overlies the apices. Mildly degraded exam due to AP portable technique and patient  body habitus. Mild cardiomegaly. No pleural effusion or pneumothorax. No congestive failure. No lobar consolidation. Left base subsegmental atelectasis or scar. IMPRESSION: No acute cardiopulmonary disease. Cardiomegaly without congestive failure. Decreased sensitivity and specificity exam due to technique related factors, as described above. Electronically Signed   By: Rockey Kilts M.D.   On: 09/30/2023 14:19   ECHOCARDIOGRAM COMPLETE Result Date: 09/30/2023    ECHOCARDIOGRAM REPORT   Patient Name:   KHAI TORBERT Date of Exam: 09/29/2023 Medical Rec #:  [id number]        Height:       61.0 in Accession #:    [id number]       Weight:       217.2 lb Date of Birth:  [birth year]         BSA:          1.956 m Patient Age:    89 years         BP:           100/54 mmHg Patient Gender: F                HR:           103 bpm. Exam Location:  [facility] Procedure: 2D Echo, Cardiac Doppler and Color Doppler (Both Spectral and Color            Flow Doppler were utilized during procedure). Indications:     I50.21 Acute Systolic CHF  History:         Patient has no prior history of Echocardiogram examinations.                  Arrythmias:Atrial Fibrillation; Risk Factors:Hypertension and                  Sleep Apnea. Chronic kidney disease.  Sonographer:     Carl Coma RDCS Referring Phys:  [id number] [facility] Diagnosing Phys: Dwayne D Callwood MD IMPRESSIONS  1. Left ventricular ejection fraction, by estimation, is 65 to 70%. The left ventricle has normal function. The left ventricle has no regional wall motion abnormalities. Left ventricular diastolic function could not be evaluated.  There is the interventricular septum is flattened in diastole ('D' shaped left ventricle), consistent with right ventricular volume overload.  2. Right ventricular systolic function is normal. The right ventricular size is normal.  3. Left atrial size was mild to moderately dilated.  4. The mitral valve is normal in structure. Mild to  moderate mitral valve regurgitation.  5. Tricuspid valve regurgitation is mild to moderate.  6. The aortic valve is normal in structure. Aortic valve regurgitation is mild. FINDINGS  Left Ventricle: Left ventricular ejection fraction, by estimation, is 65 to 70%. The left ventricle has normal function. The left ventricle has no regional wall motion abnormalities. Strain was performed and the global longitudinal strain is indeterminate. Global longitudinal strain performed but not reported based on interpreter judgement due to suboptimal tracking. The left ventricular internal cavity size was normal in size. There is borderline left ventricular hypertrophy. The interventricular septum is flattened in diastole ('D' shaped left ventricle), consistent with right ventricular volume overload. Left ventricular diastolic function could not be evaluated. Right Ventricle: The right ventricular size is normal. No increase in right ventricular wall thickness. Right ventricular systolic function is normal. Left Atrium: Left atrial size was mild to moderately dilated. Right Atrium: Right atrial size was normal in size. Pericardium: There is no evidence of pericardial effusion. Mitral Valve: The mitral valve is normal in structure. Mild to moderate mitral valve regurgitation. Tricuspid Valve: The tricuspid valve is normal in structure. Tricuspid valve regurgitation is mild to moderate. Aortic Valve: The aortic valve is normal in structure. Aortic valve regurgitation is mild. Pulmonic Valve: The pulmonic valve was normal in structure. Pulmonic valve regurgitation is trivial. Aorta: The ascending aorta was not well visualized. IAS/Shunts: No atrial level shunt detected by color flow Doppler. Additional Comments: 3D was performed not requiring image post processing on an independent workstation and was indeterminate.  LEFT VENTRICLE PLAX 2D LVIDd:         4.70 cm LVIDs:         3.00 cm LV PW:         1.10 cm LV IVS:        1.20 cm  LVOT diam:     1.90 cm LV SV:         90 LV SV Index:   46 LVOT Area:     2.84 cm  RIGHT VENTRICLE RV Basal diam:  5.30 cm RV S prime:     11.50 cm/s TAPSE (M-mode): 2.3 cm LEFT ATRIUM             Index        RIGHT ATRIUM           Index LA diam:        4.50 cm 2.30 cm/m   RA Area:     18.80 cm LA Vol (A2C):   64.3 ml 32.87 ml/m  RA Volume:   55.30 ml  28.27 ml/m LA Vol (A4C):   50.6 ml 25.87 ml/m LA Biplane Vol: 58.8 ml 30.06 ml/m  AORTIC VALVE LVOT Vmax:   157.50 cm/s LVOT Vmean:  104.100 cm/s LVOT VTI:    0.318 m  AORTA Ao Root diam: 3.30 cm Ao Asc diam:  3.70 cm MV E velocity: 140.00 cm/s  TRICUSPID VALVE                             TR Peak grad:   42.5 mmHg  TR Vmax:        326.00 cm/s                              SHUNTS                             Systemic VTI:  0.32 m                             Systemic Diam: 1.90 cm Cara JONETTA Lovelace MD Electronically signed by Cara JONETTA Lovelace MD Signature Date/Time: 09/30/2023/7:48:03 AM    Final    US  RENAL Result Date: 09/28/2023 CLINICAL DATA:  [id number] AKI (acute kidney injury) (HCC) [id number] EXAM: RENAL / URINARY TRACT ULTRASOUND COMPLETE COMPARISON:  None Available. FINDINGS: Right Kidney: Renal measurements: 12.7 x 5.1 x 4.7 cm = volume: 161 mL. Normal echogenicity. No mass. No hydronephrosis or nephrolithiasis. Left Kidney: Renal measurements: 10.7 x 5.7 x 4.5 cm = volume: 144 mL. Normal echogenicity. Hypoechoic lesion arising from the lower pole measuring 2.2 cm. No hydronephrosis or nephrolithiasis. Bladder: Appears normal for degree of bladder distention. Other: None. IMPRESSION: 1. No hydronephrosis or nephrolithiasis. 2. Hypoechoic left lower pole lesion, measuring 2.2 cm. This could represent a proteinaceous or hemorrhagic cyst. Comparison with outside imaging is recommended to document stability. If none is available, a nonemergent multiphase abdominal MRI would be recommended to exclude underlying neoplasm. These results  will be called to the ordering clinician or representative by the Radiologist Assistant and communication documented in the PACS or [employer]. Electronically Signed   By: Rogelia Myers M.D.   On: 09/28/2023 18:55   NM GI Blood Loss Result Date: 09/27/2023 CLINICAL DATA:  GI bleeding EXAM: NUCLEAR MEDICINE GASTROINTESTINAL BLEEDING SCAN TECHNIQUE: Sequential abdominal images were obtained following intravenous administration of Tc-27m labeled red blood cells. RADIOPHARMACEUTICALS:  24.4 mCi Tc-93m pertechnetate in-vitro labeled red cells. COMPARISON:  None Available. FINDINGS: There is normal prompt uptake of radiotracer into the vascular system. There is no active gastrointestinal bleeding identified. Imaging was performed over a 2 hour time frame. IMPRESSION: No active gastrointestinal bleeding identified Electronically Signed   By: Greig Pique M.D.   On: 09/27/2023 15:50    ECHO as above.  03/16/2023 NORMAL LEFT VENTRICULAR SYSTOLIC FUNCTION WITH MILD LVH  ESTIMATED EF: >55%  NORMAL LA PRESSURES WITH NORMAL DIASTOLIC FUNCTION  NORMAL RIGHT VENTRICULAR SYSTOLIC FUNCTION  VALVULAR REGURGITATION: MILD AR, MODERATE MR, TRIVIAL PR, MODERATE TR  NO VALVULAR STENOSIS   TELEMETRY reviewed by me 10/01/2023: off tele  EKG reviewed by me: Atrial fibrillation, rate 60 bpm  Data reviewed by me 10/01/2023: last 24h vitals tele labs imaging I/O hospitalist progress note.  Principal Problem:   Acute lower GI bleeding Active Problems:   A-fib (HCC)   High cholesterol   Hypertension   Sleep apnea   Stage 3b chronic kidney disease (HCC)   Obesity, Class III, BMI 40-49.9 (morbid obesity)   DNR (do not resuscitate)   Acute GI bleeding    ASSESSMENT AND PLAN:  Danely Bayliss is a 89 y.o. female  with a past medical history of hx of sinus bradycardia and pauses, hypertension, paroxymal atrial fibrillation, pure hypercholesteremia, [name], obesity, CKD stage 3b who presented to the ED on  09/27/2023 for rectal bleeding, reports bright red blood. Cardiology was consulted for further evaluation of bradycardia  and POC for EGD  # Bright red blood per rectum # Anemia Patient presents with bright red blood per rectum and diarrhea. Hgb stabilizing. Hgb 10.1 this AM. Patient underwent EGD on 06/24 revealed small hiatal hernia, hematin in gastric fundus, erosive gastropathy.  Patient underwent colonoscopy on 06/25 that revealed diverticulosis. -GI signed off.  # Paroxsymal atrial fibrillation # HX Sinus bradycardia  # Hypertension # Hypercholesteremia Prior Echo from 03/2023 with pEF, normal systolic and diastolic function. EKG this admission and tele with atrial fibrillation, rate 60s. (Longest pauses 2.96 seconds on tele on 06/24 at 0100). Per tele no more bradycardia or pauses. As states above, patient has known history of sinus bradycardia and pauses per previous holter monitors. Patient denies cardiac sxs, remains aSX.  Echo this admission with pEF, no regional wall motion abnormality, mild to mod TR. -Monitor and replenish electrolytes for a goal K around 4, Mag around 2  -Resume home Eliquis  5 mg BID for stroke risk reduction.   (Was held due to acute GI bleed. EGD/Colonoscopy with no evidence of active bleeding.) -Avoid AVN blockers -Continue amlodipine  10 mg daily.  -Continue simvastatin  20 mg daily.   No further cardiac recommendations. Cardiology will sign off. Please haiku with questions or re-engage if needed.    This patient's plan of care was discussed and created with Dr. Florencio and he is in agreement.  Signed: Dorene Comfort, PA-C  10/01/2023, 9:47 AM [facility] Cardiology

## 2023-10-01 NOTE — Progress Notes (Signed)
 Physical Therapy Treatment Patient Details Name: Stacy Conway MRN: [id number] DOB: November 15, 1934 Today's Date: 10/01/2023   History of Present Illness Pt is an 89 year old female presented on 6/23 with rectal bleeding;  EGD on 06/24 revealed small hiatal hernia, hematin in gastric fundus, erosive gastropathy, 6/25  colonoscopy only diverticulosis and no sign of any active bleeding or any old blood seen in the intestines    PMH significant for afib on Eliquis , HLD, HTN, [name], morbid obesity, and stage 3b CKD    PT Comments  Patient alert, agreeable to PT with some encouragement, waiting on lunch arrival. Pt did agree to mobilize to chair, OOB with supervision and CGA to stand with RW, but noted to need to use the bathroom. Able to ambulate in room ~76ft total, and perform pericare in standing, supervision. Pt up in chair with needs in reach, lunch in room. The patient would benefit from further skilled PT intervention to continue to progress towards goals.    If plan is discharge home, recommend the following: Assistance with cooking/housework;Assist for transportation;Help with stairs or ramp for entrance   Can travel by private vehicle        Equipment Recommendations  None recommended by PT    Recommendations for Other Services       Precautions / Restrictions Precautions Precautions: Fall Recall of Precautions/Restrictions: Intact Restrictions Weight Bearing Restrictions Per Provider Order: No     Mobility  Bed Mobility Overal bed mobility: Needs Assistance Bed Mobility: Supine to Sit     Supine to sit: Supervision, HOB elevated, Used rails          Transfers Overall transfer level: Needs assistance Equipment used: Rolling walker (2 wheels) Transfers: Sit to/from Stand Sit to Stand: Contact guard assist           General transfer comment: from EOB and from [facility] over commode    Ambulation/Gait   Gait Distance (Feet): 25 Feet Assistive device: Rolling walker (2  wheels)   Gait velocity: decreased     General Gait Details: to bathroom and back, spO2 90% or greater on room air   Stairs             Wheelchair Mobility     Tilt Bed    Modified Rankin (Stroke Patients Only)       Balance Overall balance assessment: Needs assistance Sitting-balance support: Feet supported Sitting balance-Leahy Scale: Good     Standing balance support: Bilateral upper extremity supported, During functional activity, Reliant on assistive device for balance, Single extremity supported Standing balance-Leahy Scale: Good Standing balance comment: pericare in standing                            Communication Communication Communication: No apparent difficulties  Cognition Arousal: Alert Behavior During Therapy: WFL for tasks assessed/performed   PT - Cognitive impairments: No apparent impairments                         Following commands: Intact      Cueing    Exercises      General Comments        Pertinent Vitals/Pain Pain Assessment Pain Assessment: Faces Faces Pain Scale: Hurts even more Pain Location: buttocks, skin Pain Descriptors / Indicators: Sore, Aching, Grimacing Pain Intervention(s): Limited activity within patient's tolerance, Monitored during session, Premedicated before session    Home Living  Prior Function            PT Goals (current goals can now be found in the care plan section) Progress towards PT goals: Progressing toward goals    Frequency    Min 2X/week      PT Plan      Co-evaluation              AM-PAC PT 6 Clicks Mobility   Outcome Measure  Help needed turning from your back to your side while in a flat bed without using bedrails?: None Help needed moving from lying on your back to sitting on the side of a flat bed without using bedrails?: None Help needed moving to and from a bed to a chair (including a wheelchair)?:  None Help needed standing up from a chair using your arms (e.g., wheelchair or bedside chair)?: None Help needed to walk in hospital room?: None Help needed climbing 3-5 steps with a railing? : A Little 6 Click Score: 23    End of Session Equipment Utilized During Treatment: Gait belt Activity Tolerance: Patient tolerated treatment well Patient left: in chair;with call bell/phone within reach;with chair alarm set Nurse Communication: Mobility status PT Visit Diagnosis: Difficulty in walking, not elsewhere classified (R26.2);Other abnormalities of gait and mobility (R26.89);Muscle weakness (generalized) (M62.81)     Time: 8653-8590 PT Time Calculation (min) (ACUTE ONLY): 23 min  Charges:    $Therapeutic Activity: 23-37 mins PT General Charges $$ ACUTE PT VISIT: 1 Visit                     Doyal Shams PT, DPT 2:28 PM,10/01/23

## 2023-10-01 NOTE — Progress Notes (Signed)
 Occupational Therapy Treatment Patient Details Name: Stacy Conway MRN: [id number] DOB: 04/09/1934 Today's Date: 10/01/2023   History of present illness     OT comments  Pt seen for OT treatment on this date. Upon arrival to room pt supine in bed with HOB elevated, agreeable to tx. Pt's vitals monitored while supine (documented) and requires CGA for bed mobility.  CGA for sit to stand transfer to RW with cuing on technique.  CGA/SBA for functional mobility to access bathroom at RW level and therapist adjusted 3:1 over toilet for increased independence with transfer with CGA using BUEs for toilet rails.  Patient required Min A for posterior toileting hygiene with LOB x1 when attempting extended reach, education on establishing BOS.  Standing hand hygiene with CGA at RW.  At end of session, patient requested to return to bed with HOB elevated.  Call button within reach and bed alarm on.   Pt making good progress toward goals, will continue to follow POC. Discharge recommendation remains appropriate.        If plan is discharge home, recommend the following:      Equipment Recommendations       Recommendations for Other Services      Precautions / Restrictions Precautions Precautions: Fall Recall of Precautions/Restrictions: Intact Restrictions Weight Bearing Restrictions Per Provider Order: No       Mobility Bed Mobility Overal bed mobility: Needs Assistance             General bed mobility comments: CGA with increased time from supine to sit EOB with HOB elevated    Transfers Overall transfer level: Needs assistance Equipment used: Rolling walker (2 wheels) Transfers: Sit to/from Stand Sit to Stand: Contact guard assist           General transfer comment: from EOB, from standard commode CGA     Balance Overall balance assessment: Needs assistance Sitting-balance support: Feet supported Sitting balance-Leahy Scale: Good     Standing balance support: Bilateral  upper extremity supported, During functional activity, Reliant on assistive device for balance, Single extremity supported Standing balance-Leahy Scale: Fair                             ADL either performed or assessed with clinical judgement   ADL Overall ADL's : Needs assistance/impaired                         Toilet Transfer: Contact guard assist;BSC/3in1;Regular Toilet;Rolling walker (2 wheels) Toilet Transfer Details (indicate cue type and reason): 3:1 adjusted and placed over regular toilet Toileting- Clothing Manipulation and Hygiene: Minimal assistance       Functional mobility during ADLs: Supervision/safety;Contact guard assist;Rolling walker (2 wheels)      Extremity/Trunk Assessment Upper Extremity Assessment Upper Extremity Assessment: Generalized weakness            Vision Baseline Vision/History: 0 No visual deficits     Perception     Praxis     Communication Communication Communication: No apparent difficulties   Cognition Arousal: Alert Behavior During Therapy: WFL for tasks assessed/performed Cognition: No apparent impairments                               Following commands: Intact        Cueing   Cueing Techniques: Verbal cues  Exercises Other Exercises Other Exercises: safe use of DME,  3:1 over toilet    Shoulder Instructions       General Comments      Pertinent Vitals/ Pain       Pain Assessment Pain Score: 0-No pain  Home Living                                          Prior Functioning/Environment              Frequency  Min 2X/week        Progress Toward Goals  OT Goals(current goals can now be found in the care plan section)  Progress towards OT goals: Progressing toward goals     Plan      Co-evaluation                 AM-PAC OT 6 Clicks Daily Activity     Outcome Measure   Help from another person eating meals?: None Help from  another person taking care of personal grooming?: None Help from another person toileting, which includes using toliet, bedpan, or urinal?: A Little Help from another person bathing (including washing, rinsing, drying)?: A Little Help from another person to put on and taking off regular upper body clothing?: None Help from another person to put on and taking off regular lower body clothing?: A Lot 6 Click Score: 20    End of Session Equipment Utilized During Treatment: Rolling walker (2 wheels);Gait belt  OT Visit Diagnosis: Other abnormalities of gait and mobility (R26.89);Muscle weakness (generalized) (M62.81)   Activity Tolerance Patient tolerated treatment well   Patient Left with call bell/phone within reach;in bed;with bed alarm set   Nurse Communication          Time: 8954-8869 OT Time Calculation (min): 45 min  Charges: OT General Charges $OT Visit: 1 Visit  Harlene Sharps OTR/L   Harlene LITTIE Sharps 10/01/2023, 11:39 AM

## 2023-10-01 NOTE — Plan of Care (Signed)

## 2023-10-02 DIAGNOSIS — K922 Gastrointestinal hemorrhage, unspecified: Secondary | ICD-10-CM | POA: Diagnosis not present

## 2023-10-02 DIAGNOSIS — I4811 Longstanding persistent atrial fibrillation: Secondary | ICD-10-CM

## 2023-10-02 DIAGNOSIS — Z66 Do not resuscitate: Secondary | ICD-10-CM

## 2023-10-02 LAB — CBC
HCT: 25.5 % — ABNORMAL LOW (ref 36.0–46.0)
Hemoglobin: 8.2 g/dL — ABNORMAL LOW (ref 12.0–15.0)
MCH: 28.8 pg (ref 26.0–34.0)
MCHC: 32.2 g/dL (ref 30.0–36.0)
MCV: 89.5 fL (ref 80.0–100.0)
Platelets: 193 10*3/uL (ref 150–400)
RBC: 2.85 MIL/uL — ABNORMAL LOW (ref 3.87–5.11)
RDW: 16.3 % — ABNORMAL HIGH (ref 11.5–15.5)
WBC: 7.7 10*3/uL (ref 4.0–10.5)
nRBC: 0.3 % — ABNORMAL HIGH (ref 0.0–0.2)

## 2023-10-02 LAB — BASIC METABOLIC PANEL WITH GFR
Anion gap: 9 (ref 5–15)
BUN: 27 mg/dL — ABNORMAL HIGH (ref 8–23)
CO2: 21 mmol/L — ABNORMAL LOW (ref 22–32)
Calcium: 7.8 mg/dL — ABNORMAL LOW (ref 8.9–10.3)
Chloride: 109 mmol/L (ref 98–111)
Creatinine, Ser: 1 mg/dL (ref 0.44–1.00)
GFR, Estimated: 55 mL/min — ABNORMAL LOW (ref >60)
Glucose, Bld: 108 mg/dL — ABNORMAL HIGH (ref 70–99)
Potassium: 3.5 mmol/L (ref 3.5–5.1)
Sodium: 139 mmol/L (ref 135–145)

## 2023-10-02 MED ORDER — LOPERAMIDE HCL 2 MG PO CAPS
2.0000 mg | ORAL_CAPSULE | ORAL | Status: DC | PRN
  Administered 2023-10-02 – 2023-10-03 (×2): 2 mg via ORAL
  Filled 2023-10-02 (×2): qty 1

## 2023-10-02 NOTE — Plan of Care (Signed)
   Problem: Education: Goal: Knowledge of General Education information will improve Description: Including pain rating scale, medication(s)/side effects and non-pharmacologic comfort measures Outcome: Progressing   Problem: Health Behavior/Discharge Planning: Goal: Ability to manage health-related needs will improve Outcome: Progressing   Problem: Clinical Measurements: Goal: Ability to maintain clinical measurements within normal limits will improve Outcome: Progressing Goal: Will remain free from infection Outcome: Progressing Goal: Diagnostic test results will improve Outcome: Progressing Goal: Respiratory complications will improve Outcome: Progressing Goal: Cardiovascular complication will be avoided Outcome: Progressing   Problem: Activity: Goal: Risk for activity intolerance will decrease Outcome: Progressing   Problem: Nutrition: Goal: Adequate nutrition will be maintained Outcome: Progressing   Problem: Pain Managment: Goal: General experience of comfort will improve and/or be controlled Outcome: Progressing   Problem: Safety: Goal: Ability to remain free from injury will improve Outcome: Progressing   Problem: Skin Integrity: Goal: Risk for impaired skin integrity will decrease Outcome: Progressing

## 2023-10-02 NOTE — Progress Notes (Signed)
 PROGRESS NOTE    Stacy Conway  FMW:[id number] DOB: 1934/12/11 DOA: 09/27/2023 PCP: Rudolpho Norleen BIRCH, MD  Chief Complaint  Patient presents with   Rectal Bleeding    Hospital Course:  Stacy Conway is a 89 year old female with A-fib on Eliquis , hyperlipidemia, hypertension, [name], morbid obesity, stage IIIb CKD, who presented with rectal bleeding.  Patient has chronic diarrhea secondary to IBS.  She underwent tagged RBC scan which was negative.  Hemoglobin down trended from 11.9-8.5.  Temperature 100.7.  Stool sample was sent for GI pathogens.  Gastroenterology was consulted.  Patient underwent EGD on 6/24 and colonoscopy on 6/25.  Findings included hiatal hernia, diverticulosis with old blood in intestines but no acute bleeding was found.  Subjective: This morning patient reports that she has been having diarrhea all day.  She has already had 5 episodes as of 10:30 AM.  She is feeling very weak and feels that she will be unable to make it to the bathroom in time at home.  Objective: Vitals:   10/01/23 2007 10/02/23 0415 10/02/23 0728 10/02/23 1504  BP: (!) 158/53 (!) 156/63 (!) 146/54 (!) 140/66  Pulse: (!) 56 81 64 70  Resp: 17 17 18 18   Temp: 97.9 F (36.6 C) (!) 97.5 F (36.4 C) 98.2 F (36.8 C) 97.6 F (36.4 C)  TempSrc:      SpO2: 93% 95% 95% 93%  Weight:      Height:        Intake/Output Summary (Last 24 hours) at 10/02/2023 1505 Last data filed at 10/02/2023 1300 Gross per 24 hour  Intake 460 ml  Output --  Net 460 ml   Filed Weights   09/27/23 0650 09/29/23 1350  Weight: 98.5 kg 98.5 kg    Examination: General exam: Appears calm and comfortable, NAD  Respiratory system: No work of breathing, symmetric chest wall expansion Cardiovascular system: S1 & S2 heard, RRR.  Gastrointestinal system: Abdomen is nondistended, soft and nontender.  Neuro: Alert and oriented. No focal neurological deficits. Extremities: Symmetric, expected ROM Skin: No rashes,  lesions Psychiatry: Demonstrates appropriate judgement and insight. Mood & affect appropriate for situation.   Assessment & Plan:  Principal Problem:   Acute lower GI bleeding Active Problems:   A-fib (HCC)   High cholesterol   Hypertension   Sleep apnea   Stage 3b chronic kidney disease (HCC)   Obesity, Class III, BMI 40-49.9 (morbid obesity)   DNR (do not resuscitate)   Acute GI bleeding    GI bleed Acute blood loss anemia - Tagged RBC scan negative for acute bleed - Status post EGD 6/24 and colonoscopy 6/25: Hiatal hernia, diverticulosis with old blood.  No acute bleeding. - Restart Eliquis  - Continue PPI - Continue to trend hemoglobin - Continue iron supplementation  Dirrhea - Has IBS but maintains that current level of diarrhea is beyond her baseline.  May be residual from bowel prep - As needed Imodium - Concerned that given frequency of bowel movements she is high risk for dehydration and fall at home.  Acute hypoxic respiratory failure - Intermittently requiring O2 during this admission.  Worsening desaturation with ambulation - Now consistently on room air - CXR without acute abnormalities - BNP slightly elevated above baseline, did receive Lasix  x 1 - Continue to encourage incentive spirometry  A-fib on Eliquis  - Eliquis  likely contributing factor to GI bleed as above - Cardiology was consulted and has since signed off - Patient is currently rate controlled, continue current medications  Hypertension -  Continue with amlodipine  and lisinopril   Hyperlipidemia - Continue statin  AKI with CKD stage IIIa - Resolved.  Kidney function at baseline  Left renal lesion - Incidentally seen on renal sonogram.  Hypoechoic left lower pole lesion measuring 2.2 cm could represent a proteinaceous or hemorrhagic cyst.  Comparison with outside imaging is recommended to document stability.  Nonemergent multiphase abdominal MRI recommended to exclude underlying neoplasm.   Patient was advised to follow-up outpatient with PCP for imaging  [name] - Continue CPAP  Obesity class III BMI 41 - Outpatient follow up for lifestyle modification and risk factor management  DVT prophylaxis: Elquis   Code Status: Limited: Do not attempt resuscitation (DNR) -DNR-LIMITED -Do Not Intubate/DNI  Disposition: Inpatient pending stabilization of labs and diarrhea.  Hopefully home tomorrow  Consultants:  Treatment Team:  Consulting Physician: Florencio Cara BIRCH, MD  Procedures:  EGD, Colonoscopy  Antimicrobials:  Anti-infectives (From admission, onward)    None       Data Reviewed: I have personally reviewed following labs and imaging studies CBC: Recent Labs  Lab 09/28/23 1548 09/29/23 0434 09/30/23 0335 10/01/23 0507 10/02/23 0624  WBC 14.1* 10.0 7.9 7.8 7.7  HGB 10.4* 10.1* 8.5* 9.0* 8.2*  HCT 32.5* 32.3* 26.9* 28.4* 25.5*  MCV 91.0 91.0 91.2 91.0 89.5  PLT 231 226 178 195 193   Basic Metabolic Panel: Recent Labs  Lab 09/28/23 1000 09/29/23 0434 09/30/23 0335 10/01/23 0507 10/02/23 0624  NA 138 138 138 137 139  K 5.1 5.0 4.1 4.4 3.5  CL 109 110 112* 106 109  CO2 22 21* 22 21* 21*  GLUCOSE 107* 111* 90 98 108*  BUN 52* 35* 28* 31* 27*  CREATININE 1.42* 1.02* 1.07* 1.23* 1.00  CALCIUM 8.7* 8.8* 8.0* 7.8* 7.8*  MG 2.5* 2.2 2.0  --   --   PHOS 3.4 3.0 3.6  --   --    GFR: Estimated Creatinine Clearance: 42.6 mL/min (by C-G formula based on SCr of 1 mg/dL). Liver Function Tests: Recent Labs  Lab 09/27/23 0653  AST 23  ALT 19  ALKPHOS 72  BILITOT 0.7  PROT 7.5  ALBUMIN 4.3   CBG: Recent Labs  Lab 10/01/23 1135  GLUCAP 113*    Recent Results (from the past 240 hours)  Gastrointestinal Panel by PCR , Stool     Status: None   Collection Time: 09/27/23  6:09 PM   Specimen: Stool  Result Value Ref Range Status   Campylobacter species NOT DETECTED NOT DETECTED Final   Plesimonas shigelloides NOT DETECTED NOT DETECTED Final    Salmonella species NOT DETECTED NOT DETECTED Final   Yersinia enterocolitica NOT DETECTED NOT DETECTED Final   Vibrio species NOT DETECTED NOT DETECTED Final   Vibrio cholerae NOT DETECTED NOT DETECTED Final   Enteroaggregative E coli (EAEC) NOT DETECTED NOT DETECTED Final   Enteropathogenic E coli (EPEC) NOT DETECTED NOT DETECTED Final   Enterotoxigenic E coli (ETEC) NOT DETECTED NOT DETECTED Final   Shiga like toxin producing E coli (STEC) NOT DETECTED NOT DETECTED Final   Shigella/Enteroinvasive E coli (EIEC) NOT DETECTED NOT DETECTED Final   Cryptosporidium NOT DETECTED NOT DETECTED Final   Cyclospora cayetanensis NOT DETECTED NOT DETECTED Final   Entamoeba histolytica NOT DETECTED NOT DETECTED Final   Giardia lamblia NOT DETECTED NOT DETECTED Final   Adenovirus F40/41 NOT DETECTED NOT DETECTED Final   Astrovirus NOT DETECTED NOT DETECTED Final   Norovirus GI/GII NOT DETECTED NOT DETECTED Final   Rotavirus  A NOT DETECTED NOT DETECTED Final   Sapovirus (I, II, IV, and V) NOT DETECTED NOT DETECTED Final    Comment: Performed at [facility], [address], [place], [place] [address]     Radiology Studies: No results found.  Scheduled Meds:  amLODipine   10 mg Oral QPM   apixaban   5 mg Oral BID   lisinopril   20 mg Oral QPM   pantoprazole   40 mg Oral BID AC   simvastatin   20 mg Oral QPM   sodium chloride  flush  3 mL Intravenous Q12H   Continuous Infusions:   LOS: 5 days  MDM: Patient is high risk for one or more organ failure.  They necessitate ongoing hospitalization for continued IV therapies and subsequent lab monitoring. Total time spent interpreting labs and vitals, reviewing the medical record, coordinating care amongst consultants and care team members, directly assessing and discussing care with the patient and/or family: 55 min  Srihari Shellhammer, DO [facility]  To contact the attending physician between 7A-7P please use Epic Chat. To contact the  covering physician during after hours 7P-7A, please review Amion.  10/02/2023, 3:05 PM   *This document has been created with the assistance of dictation software. Please excuse typographical errors. *

## 2023-10-02 NOTE — Plan of Care (Signed)

## 2023-10-03 DIAGNOSIS — E66813 Obesity, class 3: Secondary | ICD-10-CM

## 2023-10-03 DIAGNOSIS — I159 Secondary hypertension, unspecified: Secondary | ICD-10-CM

## 2023-10-03 DIAGNOSIS — K922 Gastrointestinal hemorrhage, unspecified: Secondary | ICD-10-CM | POA: Diagnosis not present

## 2023-10-03 DIAGNOSIS — I4811 Longstanding persistent atrial fibrillation: Secondary | ICD-10-CM | POA: Diagnosis not present

## 2023-10-03 DIAGNOSIS — E7800 Pure hypercholesterolemia, unspecified: Secondary | ICD-10-CM

## 2023-10-03 DIAGNOSIS — G4734 Idiopathic sleep related nonobstructive alveolar hypoventilation: Secondary | ICD-10-CM

## 2023-10-03 DIAGNOSIS — N1832 Chronic kidney disease, stage 3b: Secondary | ICD-10-CM

## 2023-10-03 LAB — CBC
HCT: 28.6 % — ABNORMAL LOW (ref 36.0–46.0)
Hemoglobin: 9.2 g/dL — ABNORMAL LOW (ref 12.0–15.0)
MCH: 28.5 pg (ref 26.0–34.0)
MCHC: 32.2 g/dL (ref 30.0–36.0)
MCV: 88.5 fL (ref 80.0–100.0)
Platelets: 232 10*3/uL (ref 150–400)
RBC: 3.23 MIL/uL — ABNORMAL LOW (ref 3.87–5.11)
RDW: 16.4 % — ABNORMAL HIGH (ref 11.5–15.5)
WBC: 8.3 10*3/uL (ref 4.0–10.5)
nRBC: 0 % (ref 0.0–0.2)

## 2023-10-03 LAB — BASIC METABOLIC PANEL WITH GFR
Anion gap: 9 (ref 5–15)
BUN: 24 mg/dL — ABNORMAL HIGH (ref 8–23)
CO2: 20 mmol/L — ABNORMAL LOW (ref 22–32)
Calcium: 7.9 mg/dL — ABNORMAL LOW (ref 8.9–10.3)
Chloride: 109 mmol/L (ref 98–111)
Creatinine, Ser: 1.08 mg/dL — ABNORMAL HIGH (ref 0.44–1.00)
GFR, Estimated: 50 mL/min — ABNORMAL LOW (ref >60)
Glucose, Bld: 145 mg/dL — ABNORMAL HIGH (ref 70–99)
Potassium: 3.2 mmol/L — ABNORMAL LOW (ref 3.5–5.1)
Sodium: 138 mmol/L (ref 135–145)

## 2023-10-03 MED ORDER — WITCH HAZEL-GLYCERIN EX PADS: MEDICATED_PAD | CUTANEOUS | 12 refills | Status: AC | PRN

## 2023-10-03 MED ORDER — POTASSIUM CHLORIDE CRYS ER 20 MEQ PO TBCR
40.0000 meq | EXTENDED_RELEASE_TABLET | Freq: Once | ORAL | Status: AC
  Administered 2023-10-03: 40 meq via ORAL
  Filled 2023-10-03: qty 2

## 2023-10-03 MED ORDER — PANTOPRAZOLE SODIUM 40 MG PO TBEC: 40.0000 mg | DELAYED_RELEASE_TABLET | Freq: Two times a day (BID) | ORAL | 0 refills | Status: AC

## 2023-10-03 MED ORDER — FERROUS SULFATE 325 (65 FE) MG PO TBEC: 325.0000 mg | DELAYED_RELEASE_TABLET | Freq: Every day | ORAL | 0 refills | Status: AC

## 2023-10-03 NOTE — Plan of Care (Signed)

## 2023-10-03 NOTE — Discharge Summary (Signed)
 Physician Discharge Summary  Stacy Conway FMW:[id number] DOB: [birth year] DOA: 09/27/2023  PCP: Rudolpho Norleen BIRCH, MD  Admit date: 09/27/2023 Discharge date: 10/03/2023   Recommendations for Outpatient Follow-up:  Follow up with PCP in 1 week to recheck hemoglobin and ensure it is stable. Left renal lesion: Incidentally seen on renal sonogram.  Nonemergent multiphase abdominal MRI recommended to exclude underlying neoplasm.   Hospital Course: Stacy Conway is a 89 year old female with A-fib on Eliquis , hyperlipidemia, hypertension, [name], morbid obesity, stage IIIb CKD, who presented with rectal bleeding.  Patient has chronic diarrhea secondary to IBS.  She underwent tagged RBC scan which was negative.  Hemoglobin down trended from 11.9-8.5.  Temperature 100.7.  Stool sample was sent for GI pathogens.  Gastroenterology was consulted.  Patient underwent EGD on 6/24 and colonoscopy on 6/25.  Findings included hiatal hernia, diverticulosis with old blood in intestines but no acute bleeding was found.  Eliquis  was resumed and hemoglobin remained stable.  Patient continued to have diarrhea on 6/28, and remained high risk for dehydration and hypotension, so she was kept for observation.  It gradually resolved and by 6/29 she reported feeling back to baseline.  Hemoglobin remained stable at this time.  She will need to follow-up outpatient with her PCP to recheck CBC in 1 week.   GI bleed Acute blood loss anemia - Tagged RBC scan negative for acute bleed - Status post EGD 6/24 and colonoscopy 6/25: Hiatal hernia, diverticulosis with old blood.  No acute bleeding. - Restart Eliquis  - Continue PPI - Start ferrous sulfate at discharge - Recheck CBC with PCP in 1 week   Dirrhea - Chronic, has IBS.  Flared during this admission secondary to bowel prep.  Improving now - As needed Imodium at home    Acute hypoxic respiratory failure - Intermittently requiring O2 during this admission.  Worsening  desaturation with ambulation - Now consistently on room air - CXR without acute abnormalities - BNP slightly elevated above baseline, did receive Lasix  x 1 - Continue to encourage incentive spirometry   A-fib on Eliquis  - Eliquis  likely contributing factor to GI bleed as above.  Has since been resumed.  Hemoglobin stable. - Cardiology was consulted and has since signed off - Patient is currently rate controlled, continue current medications   Hypertension - Continue with amlodipine  and lisinopril    Hyperlipidemia - Continue statin   AKI with CKD stage IIIa - Resolved.  Kidney function at baseline   Left renal lesion - Incidentally seen on renal sonogram.  Hypoechoic left lower pole lesion measuring 2.2 cm could represent a proteinaceous or hemorrhagic cyst.  Comparison with outside imaging is recommended to document stability.  Nonemergent multiphase abdominal MRI recommended to exclude underlying neoplasm.  Patient was advised to follow-up outpatient with PCP for imaging   [name] - Continue CPAP   Obesity class III BMI 41 - Outpatient follow up for lifestyle modification and risk factor management    Discharge Instructions  Discharge Instructions     Call MD for:  difficulty breathing, headache or visual disturbances   Complete by: As directed    Call MD for:  persistant dizziness or light-headedness   Complete by: As directed    Call MD for:  persistant nausea and vomiting   Complete by: As directed    Call MD for:  severe uncontrolled pain   Complete by: As directed    Call MD for:  temperature >100.4   Complete by: As directed    Diet general  Complete by: As directed    Discharge instructions   Complete by: As directed    Please follow-up with your primary care physician to repeat your hemoglobin level in 1 week   Increase activity slowly   Complete by: As directed       Allergies as of 10/03/2023       Reactions   Levofloxacin Shortness Of Breath    Aspirin  Diarrhea, Other (See Comments)   GI upset   Nitrofurantoin Nausea Only   Nsaids Diarrhea   Scallops [shellfish Allergy] Nausea And Vomiting   Penicillins Itching, Rash, Other (See Comments)   Has patient had a PCN reaction causing immediate rash, facial/tongue/throat swelling, SOB or lightheadedness with hypotension: Unknown Has patient had a PCN reaction causing severe rash involving mucus membranes or skin necrosis: No Has patient had a PCN reaction that required hospitalization: No Has patient had a PCN reaction occurring within the last 10 years: No If all of the above answers are NO, then may proceed with Cephalosporin use.   Sulfa Antibiotics Itching, Rash        Medication List     TAKE these medications    amLODipine  10 MG tablet Commonly known as: NORVASC  Take 10 mg by mouth every evening.   Eliquis  5 MG Tabs tablet Generic drug: apixaban  Take 5 mg by mouth 2 (two) times daily.   furosemide  40 MG tablet Commonly known as: LASIX  Take 40 mg by mouth daily as needed for fluid.   lisinopril  20 MG tablet Commonly known as: ZESTRIL  Take 20 mg by mouth every evening.   pantoprazole  40 MG tablet Commonly known as: PROTONIX  Take 1 tablet (40 mg total) by mouth 2 (two) times daily before a meal.   simvastatin  20 MG tablet Commonly known as: ZOCOR  Take 20 mg by mouth every evening.   witch hazel-glycerin  pad Commonly known as: TUCKS Apply topically as needed for itching.               Durable Medical Equipment  (From admission, onward)           Start     Ordered   09/30/23 1339  For home use only DME 3 n 1  Once        09/30/23 1338            Follow-up Information     Rudolpho Norleen BIRCH, MD Follow up.   Specialty: Internal Medicine Why: Office closed at this time ptient to make own follow up appt  Hospital follow up Contact information: [address] [facility] [place] [address] [phone]          Ammon Blunt, MD. Go in 1 week(s).   Specialty: Cardiology Why: Office closed at this time ptient to make own follow up appt Contact information: [address] [facility] [address] [place] [address] [phone]                Allergies  Allergen Reactions   Levofloxacin Shortness Of Breath   Aspirin  Diarrhea and Other (See Comments)    GI upset   Nitrofurantoin Nausea Only   Nsaids Diarrhea   Scallops [Shellfish Allergy] Nausea And Vomiting   Penicillins Itching, Rash and Other (See Comments)    Has patient had a PCN reaction causing immediate rash, facial/tongue/throat swelling, SOB or lightheadedness with hypotension: Unknown Has patient had a PCN reaction causing severe rash involving mucus membranes or skin necrosis: No Has patient had a PCN reaction that  required hospitalization: No Has patient had a PCN reaction occurring within the last 10 years: No If all of the above answers are NO, then may proceed with Cephalosporin use.    Sulfa Antibiotics Itching and Rash    Consultations: Treatment Team:  Florencio Cara BIRCH, MD   Procedures/Studies: DG Chest Port 1 View Result Date: 09/30/2023 CLINICAL DATA:  Shortness of breath EXAM: PORTABLE CHEST 1 VIEW COMPARISON:  03/09/2022 FINDINGS: The Chin overlies the apices. Mildly degraded exam due to AP portable technique and patient body habitus. Mild cardiomegaly. No pleural effusion or pneumothorax. No congestive failure. No lobar consolidation. Left base subsegmental atelectasis or scar. IMPRESSION: No acute cardiopulmonary disease. Cardiomegaly without congestive failure. Decreased sensitivity and specificity exam due to technique related factors, as described above. Electronically Signed   By: Rockey Kilts M.D.   On: 09/30/2023 14:19   ECHOCARDIOGRAM COMPLETE Result Date: 09/30/2023    ECHOCARDIOGRAM REPORT   Patient Name:   CLARIE CAMEY Date of Exam: 09/29/2023 Medical Rec #:  [id number]         Height:       61.0 in Accession #:    [id number]       Weight:       217.2 lb Date of Birth:  01/10/1935         BSA:          1.956 m Patient Age:    89 years         BP:           100/54 mmHg Patient Gender: F                HR:           103 bpm. Exam Location:  [facility] Procedure: 2D Echo, Cardiac Doppler and Color Doppler (Both Spectral and Color            Flow Doppler were utilized during procedure). Indications:     I50.21 Acute Systolic CHF  History:         Patient has no prior history of Echocardiogram examinations.                  Arrythmias:Atrial Fibrillation; Risk Factors:Hypertension and                  Sleep Apnea. Chronic kidney disease.  Sonographer:     Carl Coma RDCS Referring Phys:  [id number] [facility] Diagnosing Phys: Dwayne D Callwood MD IMPRESSIONS  1. Left ventricular ejection fraction, by estimation, is 65 to 70%. The left ventricle has normal function. The left ventricle has no regional wall motion abnormalities. Left ventricular diastolic function could not be evaluated. There is the interventricular septum is flattened in diastole ('D' shaped left ventricle), consistent with right ventricular volume overload.  2. Right ventricular systolic function is normal. The right ventricular size is normal.  3. Left atrial size was mild to moderately dilated.  4. The mitral valve is normal in structure. Mild to moderate mitral valve regurgitation.  5. Tricuspid valve regurgitation is mild to moderate.  6. The aortic valve is normal in structure. Aortic valve regurgitation is mild. FINDINGS  Left Ventricle: Left ventricular ejection fraction, by estimation, is 65 to 70%. The left ventricle has normal function. The left ventricle has no regional wall motion abnormalities. Strain was performed and the global longitudinal strain is indeterminate. Global longitudinal strain performed but not reported based on interpreter judgement due to suboptimal tracking. The left ventricular  internal cavity  size was normal in size. There is borderline left ventricular hypertrophy. The interventricular septum is flattened in diastole ('D' shaped left ventricle), consistent with right ventricular volume overload. Left ventricular diastolic function could not be evaluated. Right Ventricle: The right ventricular size is normal. No increase in right ventricular wall thickness. Right ventricular systolic function is normal. Left Atrium: Left atrial size was mild to moderately dilated. Right Atrium: Right atrial size was normal in size. Pericardium: There is no evidence of pericardial effusion. Mitral Valve: The mitral valve is normal in structure. Mild to moderate mitral valve regurgitation. Tricuspid Valve: The tricuspid valve is normal in structure. Tricuspid valve regurgitation is mild to moderate. Aortic Valve: The aortic valve is normal in structure. Aortic valve regurgitation is mild. Pulmonic Valve: The pulmonic valve was normal in structure. Pulmonic valve regurgitation is trivial. Aorta: The ascending aorta was not well visualized. IAS/Shunts: No atrial level shunt detected by color flow Doppler. Additional Comments: 3D was performed not requiring image post processing on an independent workstation and was indeterminate.  LEFT VENTRICLE PLAX 2D LVIDd:         4.70 cm LVIDs:         3.00 cm LV PW:         1.10 cm LV IVS:        1.20 cm LVOT diam:     1.90 cm LV SV:         90 LV SV Index:   46 LVOT Area:     2.84 cm  RIGHT VENTRICLE RV Basal diam:  5.30 cm RV S prime:     11.50 cm/s TAPSE (M-mode): 2.3 cm LEFT ATRIUM             Index        RIGHT ATRIUM           Index LA diam:        4.50 cm 2.30 cm/m   RA Area:     18.80 cm LA Vol (A2C):   64.3 ml 32.87 ml/m  RA Volume:   55.30 ml  28.27 ml/m LA Vol (A4C):   50.6 ml 25.87 ml/m LA Biplane Vol: 58.8 ml 30.06 ml/m  AORTIC VALVE LVOT Vmax:   157.50 cm/s LVOT Vmean:  104.100 cm/s LVOT VTI:    0.318 m  AORTA Ao Root diam: 3.30 cm Ao Asc diam:   3.70 cm MV E velocity: 140.00 cm/s  TRICUSPID VALVE                             TR Peak grad:   42.5 mmHg                             TR Vmax:        326.00 cm/s                              SHUNTS                             Systemic VTI:  0.32 m                             Systemic Diam: 1.90 cm Cara JONETTA Lovelace MD Electronically signed by Cara JONETTA Lovelace MD Signature  Date/Time: 09/30/2023/7:48:03 AM    Final    US  RENAL Result Date: 09/28/2023 CLINICAL DATA:  [id number] AKI (acute kidney injury) (HCC) [id number] EXAM: RENAL / URINARY TRACT ULTRASOUND COMPLETE COMPARISON:  None Available. FINDINGS: Right Kidney: Renal measurements: 12.7 x 5.1 x 4.7 cm = volume: 161 mL. Normal echogenicity. No mass. No hydronephrosis or nephrolithiasis. Left Kidney: Renal measurements: 10.7 x 5.7 x 4.5 cm = volume: 144 mL. Normal echogenicity. Hypoechoic lesion arising from the lower pole measuring 2.2 cm. No hydronephrosis or nephrolithiasis. Bladder: Appears normal for degree of bladder distention. Other: None. IMPRESSION: 1. No hydronephrosis or nephrolithiasis. 2. Hypoechoic left lower pole lesion, measuring 2.2 cm. This could represent a proteinaceous or hemorrhagic cyst. Comparison with outside imaging is recommended to document stability. If none is available, a nonemergent multiphase abdominal MRI would be recommended to exclude underlying neoplasm. These results will be called to the ordering clinician or representative by the Radiologist Assistant and communication documented in the PACS or [employer]. Electronically Signed   By: Rogelia Myers M.D.   On: 09/28/2023 18:55   NM GI Blood Loss Result Date: 09/27/2023 CLINICAL DATA:  GI bleeding EXAM: NUCLEAR MEDICINE GASTROINTESTINAL BLEEDING SCAN TECHNIQUE: Sequential abdominal images were obtained following intravenous administration of Tc-55m labeled red blood cells. RADIOPHARMACEUTICALS:  24.4 mCi Tc-68m pertechnetate in-vitro labeled red cells. COMPARISON:  None  Available. FINDINGS: There is normal prompt uptake of radiotracer into the vascular system. There is no active gastrointestinal bleeding identified. Imaging was performed over a 2 hour time frame. IMPRESSION: No active gastrointestinal bleeding identified Electronically Signed   By: Greig Pique M.D.   On: 09/27/2023 15:50      Discharge Exam: Vitals:   10/03/23 0420 10/03/23 0807  BP: (!) 143/58 (!) 151/70  Pulse: 63 69  Resp: 18 18  Temp: 98 F (36.7 C) 98.6 F (37 C)  SpO2: 96% 95%   Vitals:   10/02/23 1504 10/02/23 1952 10/03/23 0420 10/03/23 0807  BP: (!) 140/66 (!) 163/55 (!) 143/58 (!) 151/70  Pulse: 70 65 63 69  Resp: 18 18 18 18   Temp: 97.6 F (36.4 C) 98.1 F (36.7 C) 98 F (36.7 C) 98.6 F (37 C)  TempSrc:    Oral  SpO2: 93% 92% 96% 95%  Weight:      Height:        Constitutional:  Normal appearance. Non toxic-appearing.  HENT: Head Normocephalic and atraumatic.  Mucous membranes are moist.  Eyes:  Extraocular intact. Conjunctivae normal.  Cardiovascular: Rate and Rhythm: Normal rate and regular rhythm.  Pulmonary: Non labored, symmetric rise of chest wall.  Skin: warm and dry. not jaundiced.  Neurological: No focal deficit present. alert. Oriented.  Psychiatric: Mood and Affect congruent.    The results of significant diagnostics from this hospitalization (including imaging, microbiology, ancillary and laboratory) are listed below for reference.     Microbiology: Recent Results (from the past 240 hours)  Gastrointestinal Panel by PCR , Stool     Status: None   Collection Time: 09/27/23  6:09 PM   Specimen: Stool  Result Value Ref Range Status   Campylobacter species NOT DETECTED NOT DETECTED Final   Plesimonas shigelloides NOT DETECTED NOT DETECTED Final   Salmonella species NOT DETECTED NOT DETECTED Final   Yersinia enterocolitica NOT DETECTED NOT DETECTED Final   Vibrio species NOT DETECTED NOT DETECTED Final   Vibrio cholerae NOT DETECTED NOT  DETECTED Final   Enteroaggregative E coli (EAEC) NOT DETECTED NOT DETECTED Final  Enteropathogenic E coli (EPEC) NOT DETECTED NOT DETECTED Final   Enterotoxigenic E coli (ETEC) NOT DETECTED NOT DETECTED Final   Shiga like toxin producing E coli (STEC) NOT DETECTED NOT DETECTED Final   Shigella/Enteroinvasive E coli (EIEC) NOT DETECTED NOT DETECTED Final   Cryptosporidium NOT DETECTED NOT DETECTED Final   Cyclospora cayetanensis NOT DETECTED NOT DETECTED Final   Entamoeba histolytica NOT DETECTED NOT DETECTED Final   Giardia lamblia NOT DETECTED NOT DETECTED Final   Adenovirus F40/41 NOT DETECTED NOT DETECTED Final   Astrovirus NOT DETECTED NOT DETECTED Final   Norovirus GI/GII NOT DETECTED NOT DETECTED Final   Rotavirus A NOT DETECTED NOT DETECTED Final   Sapovirus (I, II, IV, and V) NOT DETECTED NOT DETECTED Final    Comment: Performed at [facility], [address] [place], [place] [address]     Labs: BNP (last 3 results) Recent Labs    09/30/23 0335 10/01/23 0507  BNP 701.2* 455.7*   Basic Metabolic Panel: Recent Labs  Lab 09/28/23 1000 09/29/23 0434 09/30/23 0335 10/01/23 0507 10/02/23 0624 10/03/23 0423  NA 138 138 138 137 139 138  K 5.1 5.0 4.1 4.4 3.5 3.2*  CL 109 110 112* 106 109 109  CO2 22 21* 22 21* 21* 20*  GLUCOSE 107* 111* 90 98 108* 145*  BUN 52* 35* 28* 31* 27* 24*  CREATININE 1.42* 1.02* 1.07* 1.23* 1.00 1.08*  CALCIUM 8.7* 8.8* 8.0* 7.8* 7.8* 7.9*  MG 2.5* 2.2 2.0  --   --   --   PHOS 3.4 3.0 3.6  --   --   --    Liver Function Tests: Recent Labs  Lab 09/27/23 0653  AST 23  ALT 19  ALKPHOS 72  BILITOT 0.7  PROT 7.5  ALBUMIN 4.3   No results for input(s): LIPASE, AMYLASE in the last 168 hours. No results for input(s): AMMONIA in the last 168 hours. CBC: Recent Labs  Lab 09/29/23 0434 09/30/23 0335 10/01/23 0507 10/02/23 0624 10/03/23 0423  WBC 10.0 7.9 7.8 7.7 8.3  HGB 10.1* 8.5* 9.0* 8.2* 9.2*  HCT 32.3* 26.9*  28.4* 25.5* 28.6*  MCV 91.0 91.2 91.0 89.5 88.5  PLT 226 178 195 193 232   Cardiac Enzymes: No results for input(s): CKTOTAL, CKMB, CKMBINDEX, TROPONINI in the last 168 hours. BNP: Invalid input(s): POCBNP CBG: Recent Labs  Lab 10/01/23 1135  GLUCAP 113*   D-Dimer No results for input(s): DDIMER in the last 72 hours. Hgb A1c No results for input(s): HGBA1C in the last 72 hours. Lipid Profile No results for input(s): CHOL, HDL, LDLCALC, TRIG, CHOLHDL, LDLDIRECT in the last 72 hours. Thyroid  function studies No results for input(s): TSH, T4TOTAL, T3FREE, THYROIDAB in the last 72 hours.  Invalid input(s): FREET3 Anemia work up No results for input(s): VITAMINB12, FOLATE, FERRITIN, TIBC, IRON, RETICCTPCT in the last 72 hours. Urinalysis    Component Value Date/Time   COLORURINE YELLOW 04/15/2017 1508   APPEARANCEUR CLEAR 04/15/2017 1508   LABSPEC <1.005 (L) 04/15/2017 1508   PHURINE 5.5 04/15/2017 1508   GLUCOSEU NEGATIVE 04/15/2017 1508   HGBUR NEGATIVE 04/15/2017 1508   BILIRUBINUR NEGATIVE 04/15/2017 1508   KETONESUR NEGATIVE 04/15/2017 1508   PROTEINUR NEGATIVE 04/15/2017 1508   NITRITE NEGATIVE 04/15/2017 1508   LEUKOCYTESUR NEGATIVE 04/15/2017 1508   Sepsis Labs Recent Labs  Lab 09/30/23 0335 10/01/23 0507 10/02/23 0624 10/03/23 0423  WBC 7.9 7.8 7.7 8.3   Microbiology Recent Results (from the past 240 hours)  Gastrointestinal Panel by PCR , Stool     Status: None   Collection Time: 09/27/23  6:09 PM   Specimen: Stool  Result Value Ref Range Status   Campylobacter species NOT DETECTED NOT DETECTED Final   Plesimonas shigelloides NOT DETECTED NOT DETECTED Final   Salmonella species NOT DETECTED NOT DETECTED Final   Yersinia enterocolitica NOT DETECTED NOT DETECTED Final   Vibrio species NOT DETECTED NOT DETECTED Final   Vibrio cholerae NOT DETECTED NOT DETECTED Final   Enteroaggregative E coli (EAEC) NOT  DETECTED NOT DETECTED Final   Enteropathogenic E coli (EPEC) NOT DETECTED NOT DETECTED Final   Enterotoxigenic E coli (ETEC) NOT DETECTED NOT DETECTED Final   Shiga like toxin producing E coli (STEC) NOT DETECTED NOT DETECTED Final   Shigella/Enteroinvasive E coli (EIEC) NOT DETECTED NOT DETECTED Final   Cryptosporidium NOT DETECTED NOT DETECTED Final   Cyclospora cayetanensis NOT DETECTED NOT DETECTED Final   Entamoeba histolytica NOT DETECTED NOT DETECTED Final   Giardia lamblia NOT DETECTED NOT DETECTED Final   Adenovirus F40/41 NOT DETECTED NOT DETECTED Final   Astrovirus NOT DETECTED NOT DETECTED Final   Norovirus GI/GII NOT DETECTED NOT DETECTED Final   Rotavirus A NOT DETECTED NOT DETECTED Final   Sapovirus (I, II, IV, and V) NOT DETECTED NOT DETECTED Final    Comment: Performed at [facility], [address], [place], [place] [address]     Time coordinating discharge: 32 min   SIGNED: Lorane Poland, MD  [facility] 10/03/2023, 12:43 PM Pager   If 7PM-7AM, please contact night-coverage

## 2023-11-08 ENCOUNTER — Other Ambulatory Visit: Payer: Self-pay | Admitting: Nurse Practitioner

## 2023-11-08 DIAGNOSIS — N289 Disorder of kidney and ureter, unspecified: Secondary | ICD-10-CM

## 2023-11-08 DIAGNOSIS — N1832 Chronic kidney disease, stage 3b: Secondary | ICD-10-CM

## 2024-02-11 ENCOUNTER — Other Ambulatory Visit: Payer: Self-pay

## 2024-02-11 ENCOUNTER — Emergency Department
Admission: EM | Admit: 2024-02-11 | Discharge: 2024-02-11 | Disposition: A | Discharge status: Home / Self Care | Attending: Emergency Medicine | Admitting: Emergency Medicine

## 2024-02-11 DIAGNOSIS — Z7901 Long term (current) use of anticoagulants: Secondary | ICD-10-CM | POA: Insufficient documentation

## 2024-02-11 DIAGNOSIS — I4891 Unspecified atrial fibrillation: Secondary | ICD-10-CM | POA: Insufficient documentation

## 2024-02-11 DIAGNOSIS — Z139 Encounter for screening, unspecified: Secondary | ICD-10-CM

## 2024-02-11 DIAGNOSIS — Z0000 Encounter for general adult medical examination without abnormal findings: Secondary | ICD-10-CM | POA: Insufficient documentation

## 2024-02-11 DIAGNOSIS — N1832 Chronic kidney disease, stage 3b: Secondary | ICD-10-CM | POA: Insufficient documentation

## 2024-02-11 LAB — CBC
HCT: 37.7 % (ref 36.0–46.0)
Hemoglobin: 11.9 g/dL — ABNORMAL LOW (ref 12.0–15.0)
MCH: 28.7 pg (ref 26.0–34.0)
MCHC: 31.6 g/dL (ref 30.0–36.0)
MCV: 91.1 fL (ref 80.0–100.0)
Platelets: 253 K/uL (ref 150–400)
RBC: 4.14 MIL/uL (ref 3.87–5.11)
RDW: 15.3 % (ref 11.5–15.5)
WBC: 7.6 K/uL (ref 4.0–10.5)
nRBC: 0 % (ref 0.0–0.2)

## 2024-02-11 LAB — BASIC METABOLIC PANEL WITH GFR
Anion gap: 15 (ref 5–15)
BUN: 35 mg/dL — ABNORMAL HIGH (ref 8–23)
CO2: 23 mmol/L (ref 22–32)
Calcium: 9.1 mg/dL (ref 8.9–10.3)
Chloride: 99 mmol/L (ref 98–111)
Creatinine, Ser: 1.41 mg/dL — ABNORMAL HIGH (ref 0.44–1.00)
GFR, Estimated: 36 mL/min — ABNORMAL LOW (ref >60)
Glucose, Bld: 102 mg/dL — ABNORMAL HIGH (ref 70–99)
Potassium: 4.8 mmol/L (ref 3.5–5.1)
Sodium: 137 mmol/L (ref 135–145)

## 2024-02-11 LAB — TROPONIN I (HIGH SENSITIVITY): Troponin I (High Sensitivity): 12 ng/L (ref <18)

## 2024-02-11 NOTE — ED Triage Notes (Signed)
 Pt to ED with family member for hyperkalemia seen on labs done yesterday with PCP. Potassium was 6.1. Hx stage 3b CKD and sees nephrologist Lateef.  Pt injured her lower back yesterday and has been taking tylenol . Denies CP, SOB, dizziness. Takes Eliquis  for a fib so has not had her dose today. Alert, oriented with unlabored respirations.

## 2024-02-11 NOTE — Discharge Instructions (Signed)
 Potassium here is 4.8.   Follow-up with your PCP and nephrologist, and return to the ED with any worsening symptoms or other concerns

## 2024-02-11 NOTE — ED Notes (Addendum)
 Fall risk bracelet on, bed alarm on, and non skid shoes on. Pt stated she would like to sit on the edge of the bed and daughter is at bedside.

## 2024-02-11 NOTE — ED Provider Notes (Signed)
 [facility] Provider Note    Event Date/Time   First MD Initiated Contact with Patient 02/11/24 1333     (approximate)   History   Abnormal Lab   HPI  Stacy Conway is a 89 y.o. female who presents to the ED for evaluation of Abnormal Lab   I review information from nephrology clinic visit in August.  Creatinine 1.28/GFR 41.  Patient presents to the ED asymptomatic due to reported outpatient blood work with potassium of 6.1, drawn yesterday at routine PCP visit.  She reports no complaints or concerns and feels fine   Physical Exam   Triage Vital Signs: ED Triage Vitals  Encounter Vitals Group     BP 02/11/24 1314 (!) 176/84     Girls Systolic BP Percentile --      Girls Diastolic BP Percentile --      Boys Systolic BP Percentile --      Boys Diastolic BP Percentile --      Pulse Rate 02/11/24 1311 (!) 54     Resp 02/11/24 1311 20     Temp 02/11/24 1311 97.9 F (36.6 C)     Temp Source 02/11/24 1311 Oral     SpO2 02/11/24 1311 99 %     Weight 02/11/24 1314 195 lb (88.5 kg)     Height 02/11/24 1314 5' 1 (1.549 m)     Head Circumference --      Peak Flow --      Pain Score 02/11/24 1312 4     Pain Loc --      Pain Education --      Exclude from Growth Chart --     Most recent vital signs: Vitals:   02/11/24 1311 02/11/24 1314  BP:  (!) 176/84  Pulse: (!) 54   Resp: 20   Temp: 97.9 F (36.6 C)   SpO2: 99%     General: Awake, no distress.  CV:  Good peripheral perfusion.  Resp:  Normal effort.  Abd:  No distention.  MSK:  No deformity noted.  Neuro:  No focal deficits appreciated. Other:     ED Results / Procedures / Treatments   Labs (all labs ordered are listed, but only abnormal results are displayed) Labs Reviewed  CBC - Abnormal; Notable for the following components:      Result Value   Hemoglobin 11.9 (*)    All other components within normal limits  BASIC METABOLIC PANEL WITH GFR  TROPONIN I (HIGH  SENSITIVITY)    EKG A-fib with a rate of 65 bpm.  No STEMI  RADIOLOGY   Official radiology report(s): No results found.  PROCEDURES and INTERVENTIONS:  Procedures  Medications - No data to display   IMPRESSION / MDM / ASSESSMENT AND PLAN / ED COURSE  I reviewed the triage vital signs and the nursing notes.  Differential diagnosis includes, but is not limited to, hemolyzed specimen, hyperkalemia, renal failure  {Patient presents with symptoms of an acute illness or injury that is potentially life-threatening.  Patient presents with reported hyperkalemia, without evidence of such on our testing and suitable for outpatient management.  Asymptomatic with normal exam.  Potassium here is normal with baseline renal function.  No indications for further workup or admission      FINAL CLINICAL IMPRESSION(S) / ED DIAGNOSES   Final diagnoses:  None     Rx / DC Orders   ED Discharge Orders     None  Note:  This document was prepared using Dragon voice recognition software and may include unintentional dictation errors.   Claudene Rover, MD 02/11/24 [phone]
